# Patient Record
Sex: Female | Born: 1965
Health system: Southern US, Community
[De-identification: ages and names within clinical notes are randomized; demographics above are authoritative.]

## PROBLEM LIST (undated history)

## (undated) DIAGNOSIS — M1990 Unspecified osteoarthritis, unspecified site: Secondary | ICD-10-CM

## (undated) DIAGNOSIS — K219 Gastro-esophageal reflux disease without esophagitis: Secondary | ICD-10-CM

## (undated) DIAGNOSIS — I10 Essential (primary) hypertension: Secondary | ICD-10-CM

## (undated) HISTORY — DX: Gastro-esophageal reflux disease without esophagitis: K21.9

## (undated) HISTORY — PX: ABDOMINAL HYSTERECTOMY: SHX81

## (undated) HISTORY — DX: Unspecified osteoarthritis, unspecified site: M19.90

---

## 2000-08-10 ENCOUNTER — Other Ambulatory Visit: Admission: RE | Admit: 2000-08-10 | Discharge: 2000-08-10 | Payer: Self-pay | Admitting: Obstetrics and Gynecology

## 2002-02-01 ENCOUNTER — Other Ambulatory Visit: Admission: RE | Admit: 2002-02-01 | Discharge: 2002-02-01 | Payer: Self-pay | Admitting: Obstetrics and Gynecology

## 2003-05-02 ENCOUNTER — Other Ambulatory Visit: Admission: RE | Admit: 2003-05-02 | Discharge: 2003-05-02 | Payer: Self-pay | Admitting: Obstetrics and Gynecology

## 2004-07-07 ENCOUNTER — Other Ambulatory Visit: Admission: RE | Admit: 2004-07-07 | Discharge: 2004-07-07 | Payer: Self-pay | Admitting: Obstetrics and Gynecology

## 2006-07-12 ENCOUNTER — Ambulatory Visit (HOSPITAL_COMMUNITY): Admission: RE | Admit: 2006-07-12 | Discharge: 2006-07-12 | Payer: Self-pay | Admitting: Family Medicine

## 2006-11-06 ENCOUNTER — Ambulatory Visit (HOSPITAL_COMMUNITY): Admission: RE | Admit: 2006-11-06 | Discharge: 2006-11-06 | Payer: Self-pay | Admitting: Family Medicine

## 2006-11-17 ENCOUNTER — Ambulatory Visit (HOSPITAL_COMMUNITY): Admission: RE | Admit: 2006-11-17 | Discharge: 2006-11-17 | Payer: Self-pay | Admitting: Family Medicine

## 2007-08-08 ENCOUNTER — Ambulatory Visit (HOSPITAL_COMMUNITY): Admission: RE | Admit: 2007-08-08 | Discharge: 2007-08-08 | Payer: Self-pay | Admitting: Obstetrics and Gynecology

## 2007-09-05 ENCOUNTER — Other Ambulatory Visit: Admission: RE | Admit: 2007-09-05 | Discharge: 2007-09-05 | Payer: Self-pay | Admitting: Obstetrics and Gynecology

## 2009-08-31 ENCOUNTER — Emergency Department (HOSPITAL_COMMUNITY): Admission: EM | Admit: 2009-08-31 | Discharge: 2009-08-31 | Payer: Self-pay | Admitting: Emergency Medicine

## 2010-01-31 ENCOUNTER — Encounter: Payer: Self-pay | Admitting: Family Medicine

## 2010-03-26 LAB — URINALYSIS, ROUTINE W REFLEX MICROSCOPIC
Bilirubin Urine: NEGATIVE
Glucose, UA: NEGATIVE mg/dL
Hgb urine dipstick: NEGATIVE
Nitrite: NEGATIVE
Protein, ur: NEGATIVE mg/dL

## 2010-09-27 ENCOUNTER — Ambulatory Visit (HOSPITAL_COMMUNITY)
Admission: RE | Admit: 2010-09-27 | Discharge: 2010-09-27 | Disposition: A | Discharge status: Home / Self Care | Payer: PRIVATE HEALTH INSURANCE | Source: Ambulatory Visit | Attending: Family Medicine | Admitting: Family Medicine

## 2010-09-27 ENCOUNTER — Other Ambulatory Visit (HOSPITAL_COMMUNITY): Payer: Self-pay | Admitting: Family Medicine

## 2010-09-27 DIAGNOSIS — K219 Gastro-esophageal reflux disease without esophagitis: Secondary | ICD-10-CM

## 2010-09-27 DIAGNOSIS — R079 Chest pain, unspecified: Secondary | ICD-10-CM | POA: Insufficient documentation

## 2010-09-27 DIAGNOSIS — R7611 Nonspecific reaction to tuberculin skin test without active tuberculosis: Secondary | ICD-10-CM | POA: Insufficient documentation

## 2010-09-27 DIAGNOSIS — R0602 Shortness of breath: Secondary | ICD-10-CM | POA: Insufficient documentation

## 2010-10-08 ENCOUNTER — Ambulatory Visit (HOSPITAL_COMMUNITY)
Admission: RE | Admit: 2010-10-08 | Discharge: 2010-10-08 | Disposition: A | Discharge status: Home / Self Care | Payer: PRIVATE HEALTH INSURANCE | Source: Ambulatory Visit | Attending: Cardiovascular Disease | Admitting: Cardiovascular Disease

## 2010-10-08 DIAGNOSIS — R0602 Shortness of breath: Secondary | ICD-10-CM | POA: Insufficient documentation

## 2010-10-15 NOTE — Procedures (Signed)
NAME:  Wendy Rodriguez, Wendy Rodriguez                       ACCOUNT NO.:  MEDICAL RECORD NO.:  [id number]  LOCATION:                                 FACILITY:  PHYSICIAN:  Amelia Macken L. Juanetta Gosling, M.D.DATE OF BIRTH:  08/03/1965  DATE OF PROCEDURE: DATE OF DISCHARGE:                           PULMONARY FUNCTION TEST   REASON FOR PULMONARY FUNCTION TESTING:  Shortness of breath. 1. Spirometry is normal. 2. Lung volumes are normal. 3. DLCO is normal. 4. There is no cause of shortness of breath demonstrated on this PFT.     Aki Abalos L. Juanetta Gosling, M.D.     ELH/MEDQ  D:  10/14/2010  T:  10/14/2010  Job:  [id number]  cc:   Thurmon Fair, MD Fax: [phone]

## 2011-09-09 ENCOUNTER — Ambulatory Visit (HOSPITAL_COMMUNITY)
Admission: RE | Admit: 2011-09-09 | Discharge: 2011-09-09 | Disposition: A | Discharge status: Home / Self Care | Payer: Commercial Managed Care - PPO | Source: Ambulatory Visit | Attending: Family Medicine | Admitting: Family Medicine

## 2011-09-09 ENCOUNTER — Other Ambulatory Visit (HOSPITAL_COMMUNITY): Payer: Self-pay | Admitting: Family Medicine

## 2011-09-09 DIAGNOSIS — R079 Chest pain, unspecified: Secondary | ICD-10-CM

## 2011-09-09 DIAGNOSIS — R059 Cough, unspecified: Secondary | ICD-10-CM | POA: Insufficient documentation

## 2011-09-09 DIAGNOSIS — R7611 Nonspecific reaction to tuberculin skin test without active tuberculosis: Secondary | ICD-10-CM | POA: Insufficient documentation

## 2011-09-09 DIAGNOSIS — K219 Gastro-esophageal reflux disease without esophagitis: Secondary | ICD-10-CM

## 2011-09-09 DIAGNOSIS — R05 Cough: Secondary | ICD-10-CM | POA: Insufficient documentation

## 2011-09-09 DIAGNOSIS — R0602 Shortness of breath: Secondary | ICD-10-CM | POA: Insufficient documentation

## 2014-10-09 ENCOUNTER — Encounter (HOSPITAL_COMMUNITY): Admission: RE | Payer: Self-pay | Source: Ambulatory Visit

## 2014-10-09 ENCOUNTER — Ambulatory Visit (HOSPITAL_COMMUNITY)
Admission: RE | Admit: 2014-10-09 | Payer: Commercial Managed Care - PPO | Source: Ambulatory Visit | Admitting: Obstetrics and Gynecology

## 2014-10-09 SURGERY — HYSTERECTOMY, VAGINAL, LAPAROSCOPY-ASSISTED
Anesthesia: General

## 2015-11-12 ENCOUNTER — Other Ambulatory Visit: Payer: Self-pay | Admitting: Orthopedic Surgery

## 2015-11-12 DIAGNOSIS — M4316 Spondylolisthesis, lumbar region: Secondary | ICD-10-CM

## 2015-11-18 ENCOUNTER — Inpatient Hospital Stay: Admission: RE | Admit: 2015-11-18 | Payer: Commercial Managed Care - PPO | Source: Ambulatory Visit

## 2016-01-29 DIAGNOSIS — N950 Postmenopausal bleeding: Secondary | ICD-10-CM | POA: Diagnosis not present

## 2016-01-29 DIAGNOSIS — R799 Abnormal finding of blood chemistry, unspecified: Secondary | ICD-10-CM | POA: Diagnosis not present

## 2016-02-03 DIAGNOSIS — R945 Abnormal results of liver function studies: Secondary | ICD-10-CM | POA: Diagnosis not present

## 2016-02-23 DIAGNOSIS — N950 Postmenopausal bleeding: Secondary | ICD-10-CM | POA: Diagnosis not present

## 2016-02-23 DIAGNOSIS — R102 Pelvic and perineal pain: Secondary | ICD-10-CM | POA: Diagnosis not present

## 2016-04-08 DIAGNOSIS — R7989 Other specified abnormal findings of blood chemistry: Secondary | ICD-10-CM | POA: Diagnosis not present

## 2016-04-11 DIAGNOSIS — Z23 Encounter for immunization: Secondary | ICD-10-CM | POA: Diagnosis not present

## 2016-04-14 ENCOUNTER — Ambulatory Visit
Admission: RE | Admit: 2016-04-14 | Discharge: 2016-04-14 | Disposition: A | Discharge status: Home / Self Care | Payer: 59 | Source: Ambulatory Visit | Attending: Orthopedic Surgery | Admitting: Orthopedic Surgery

## 2016-04-14 DIAGNOSIS — M48061 Spinal stenosis, lumbar region without neurogenic claudication: Secondary | ICD-10-CM | POA: Diagnosis not present

## 2016-04-14 DIAGNOSIS — M4316 Spondylolisthesis, lumbar region: Secondary | ICD-10-CM

## 2016-04-14 DIAGNOSIS — R945 Abnormal results of liver function studies: Secondary | ICD-10-CM | POA: Diagnosis not present

## 2016-04-22 DIAGNOSIS — M47816 Spondylosis without myelopathy or radiculopathy, lumbar region: Secondary | ICD-10-CM | POA: Diagnosis not present

## 2016-04-22 DIAGNOSIS — M4316 Spondylolisthesis, lumbar region: Secondary | ICD-10-CM | POA: Diagnosis not present

## 2016-05-04 DIAGNOSIS — M47812 Spondylosis without myelopathy or radiculopathy, cervical region: Secondary | ICD-10-CM | POA: Diagnosis not present

## 2016-05-04 DIAGNOSIS — M4126 Other idiopathic scoliosis, lumbar region: Secondary | ICD-10-CM | POA: Diagnosis not present

## 2016-05-04 DIAGNOSIS — M47816 Spondylosis without myelopathy or radiculopathy, lumbar region: Secondary | ICD-10-CM | POA: Diagnosis not present

## 2016-05-11 DIAGNOSIS — Z23 Encounter for immunization: Secondary | ICD-10-CM | POA: Diagnosis not present

## 2016-05-18 DIAGNOSIS — M47816 Spondylosis without myelopathy or radiculopathy, lumbar region: Secondary | ICD-10-CM | POA: Diagnosis not present

## 2016-05-20 DIAGNOSIS — S9031XA Contusion of right foot, initial encounter: Secondary | ICD-10-CM | POA: Diagnosis not present

## 2016-06-14 DIAGNOSIS — M47816 Spondylosis without myelopathy or radiculopathy, lumbar region: Secondary | ICD-10-CM | POA: Diagnosis not present

## 2016-08-18 DIAGNOSIS — S92515A Nondisplaced fracture of proximal phalanx of left lesser toe(s), initial encounter for closed fracture: Secondary | ICD-10-CM | POA: Diagnosis not present

## 2016-08-18 DIAGNOSIS — M79672 Pain in left foot: Secondary | ICD-10-CM | POA: Diagnosis not present

## 2016-10-06 DIAGNOSIS — M722 Plantar fascial fibromatosis: Secondary | ICD-10-CM | POA: Diagnosis not present

## 2016-10-06 DIAGNOSIS — M79672 Pain in left foot: Secondary | ICD-10-CM | POA: Diagnosis not present

## 2016-10-12 DIAGNOSIS — M47817 Spondylosis without myelopathy or radiculopathy, lumbosacral region: Secondary | ICD-10-CM | POA: Diagnosis not present

## 2016-10-12 DIAGNOSIS — M47812 Spondylosis without myelopathy or radiculopathy, cervical region: Secondary | ICD-10-CM | POA: Diagnosis not present

## 2016-10-12 DIAGNOSIS — M4126 Other idiopathic scoliosis, lumbar region: Secondary | ICD-10-CM | POA: Diagnosis not present

## 2016-10-12 DIAGNOSIS — M47816 Spondylosis without myelopathy or radiculopathy, lumbar region: Secondary | ICD-10-CM | POA: Diagnosis not present

## 2016-10-18 DIAGNOSIS — Z23 Encounter for immunization: Secondary | ICD-10-CM | POA: Diagnosis not present

## 2016-12-16 DIAGNOSIS — J069 Acute upper respiratory infection, unspecified: Secondary | ICD-10-CM | POA: Diagnosis not present

## 2016-12-28 DIAGNOSIS — Z01419 Encounter for gynecological examination (general) (routine) without abnormal findings: Secondary | ICD-10-CM | POA: Diagnosis not present

## 2017-01-04 DIAGNOSIS — K219 Gastro-esophageal reflux disease without esophagitis: Secondary | ICD-10-CM | POA: Diagnosis not present

## 2017-01-04 DIAGNOSIS — H6991 Unspecified Eustachian tube disorder, right ear: Secondary | ICD-10-CM | POA: Diagnosis not present

## 2017-01-04 DIAGNOSIS — H6591 Unspecified nonsuppurative otitis media, right ear: Secondary | ICD-10-CM | POA: Diagnosis not present

## 2017-03-24 DIAGNOSIS — H60502 Unspecified acute noninfective otitis externa, left ear: Secondary | ICD-10-CM | POA: Diagnosis not present

## 2017-03-24 DIAGNOSIS — H6692 Otitis media, unspecified, left ear: Secondary | ICD-10-CM | POA: Diagnosis not present

## 2017-03-24 DIAGNOSIS — J302 Other seasonal allergic rhinitis: Secondary | ICD-10-CM | POA: Diagnosis not present

## 2017-03-24 DIAGNOSIS — E663 Overweight: Secondary | ICD-10-CM | POA: Diagnosis not present

## 2017-10-31 DIAGNOSIS — Z23 Encounter for immunization: Secondary | ICD-10-CM | POA: Diagnosis not present

## 2017-11-17 DIAGNOSIS — Z13220 Encounter for screening for lipoid disorders: Secondary | ICD-10-CM | POA: Diagnosis not present

## 2017-11-17 DIAGNOSIS — Z131 Encounter for screening for diabetes mellitus: Secondary | ICD-10-CM | POA: Diagnosis not present

## 2017-11-17 DIAGNOSIS — Z0000 Encounter for general adult medical examination without abnormal findings: Secondary | ICD-10-CM | POA: Diagnosis not present

## 2017-11-17 DIAGNOSIS — M4316 Spondylolisthesis, lumbar region: Secondary | ICD-10-CM | POA: Diagnosis not present

## 2017-11-17 DIAGNOSIS — M545 Low back pain: Secondary | ICD-10-CM | POA: Diagnosis not present

## 2017-11-17 DIAGNOSIS — Z13228 Encounter for screening for other metabolic disorders: Secondary | ICD-10-CM | POA: Diagnosis not present

## 2017-11-17 DIAGNOSIS — M542 Cervicalgia: Secondary | ICD-10-CM | POA: Diagnosis not present

## 2017-11-17 DIAGNOSIS — M4126 Other idiopathic scoliosis, lumbar region: Secondary | ICD-10-CM | POA: Diagnosis not present

## 2017-11-17 DIAGNOSIS — M47816 Spondylosis without myelopathy or radiculopathy, lumbar region: Secondary | ICD-10-CM | POA: Diagnosis not present

## 2017-11-21 DIAGNOSIS — E663 Overweight: Secondary | ICD-10-CM | POA: Diagnosis not present

## 2017-11-21 DIAGNOSIS — I10 Essential (primary) hypertension: Secondary | ICD-10-CM | POA: Diagnosis not present

## 2017-11-21 DIAGNOSIS — Z6828 Body mass index (BMI) 28.0-28.9, adult: Secondary | ICD-10-CM | POA: Diagnosis not present

## 2017-11-29 DIAGNOSIS — Z6828 Body mass index (BMI) 28.0-28.9, adult: Secondary | ICD-10-CM | POA: Diagnosis not present

## 2017-11-29 DIAGNOSIS — E663 Overweight: Secondary | ICD-10-CM | POA: Diagnosis not present

## 2017-11-29 DIAGNOSIS — J22 Unspecified acute lower respiratory infection: Secondary | ICD-10-CM | POA: Diagnosis not present

## 2017-12-15 DIAGNOSIS — Z6829 Body mass index (BMI) 29.0-29.9, adult: Secondary | ICD-10-CM | POA: Diagnosis not present

## 2017-12-15 DIAGNOSIS — Z01419 Encounter for gynecological examination (general) (routine) without abnormal findings: Secondary | ICD-10-CM | POA: Diagnosis not present

## 2018-04-05 DIAGNOSIS — E663 Overweight: Secondary | ICD-10-CM | POA: Diagnosis not present

## 2018-04-05 DIAGNOSIS — Z6828 Body mass index (BMI) 28.0-28.9, adult: Secondary | ICD-10-CM | POA: Diagnosis not present

## 2018-04-05 DIAGNOSIS — L209 Atopic dermatitis, unspecified: Secondary | ICD-10-CM | POA: Diagnosis not present

## 2018-04-12 DIAGNOSIS — J329 Chronic sinusitis, unspecified: Secondary | ICD-10-CM | POA: Diagnosis not present

## 2018-04-12 DIAGNOSIS — K219 Gastro-esophageal reflux disease without esophagitis: Secondary | ICD-10-CM | POA: Diagnosis not present

## 2018-04-12 DIAGNOSIS — J309 Allergic rhinitis, unspecified: Secondary | ICD-10-CM | POA: Diagnosis not present

## 2019-01-13 ENCOUNTER — Ambulatory Visit
Admission: EM | Admit: 2019-01-13 | Discharge: 2019-01-13 | Disposition: A | Discharge status: Home / Self Care | Payer: BC Managed Care – PPO | Attending: Emergency Medicine | Admitting: Emergency Medicine

## 2019-01-13 ENCOUNTER — Other Ambulatory Visit: Payer: Self-pay

## 2019-01-13 DIAGNOSIS — Z20822 Contact with and (suspected) exposure to covid-19: Secondary | ICD-10-CM | POA: Diagnosis not present

## 2019-01-13 HISTORY — DX: Essential (primary) hypertension: I10

## 2019-01-13 MED ORDER — FLUTICASONE PROPIONATE 50 MCG/ACT NA SUSP: 1.0000 | Freq: Every day | NASAL | 0 refills | Status: DC

## 2019-01-13 MED ORDER — BENZONATATE 100 MG PO CAPS: 100.0000 mg | ORAL_CAPSULE | Freq: Three times a day (TID) | ORAL | 0 refills | Status: DC

## 2019-01-13 NOTE — ED Provider Notes (Signed)
[facility]    CSN: [id number] Arrival date & time: 01/13/19  1315      History   Chief Complaint Chief Complaint  Patient presents with  . Cough  . Headache    HPI Wendy Rodriguez is a 54 y.o. female.   Wendy Rodriguez 54 years old female presented to the urgent care with a complaint of cough, and headaches for the past 3 days.  Denies sick exposure to COVID, flu or strep.  Denies recent travel.  Denies aggravating or alleviating symptoms.  Denies previous COVID infection.   Denies fever, chills, fatigue, nasal congestion, rhinorrhea, sore throat,  SOB, wheezing, chest pain, nausea, vomiting, changes in bowel or bladder habits.    The history is provided by the patient. No language interpreter was used.  Cough Associated symptoms: headaches   Headache Associated symptoms: cough     Past Medical History:  Diagnosis Date  . Hypertension     There are no problems to display for this patient.   Past Surgical History:  Procedure Laterality Date  . ABDOMINAL HYSTERECTOMY      OB History   No obstetric history on file.      Home Medications    Prior to Admission medications   Medication Sig Start Date End Date Taking? Authorizing Provider  benzonatate (TESSALON) 100 MG capsule Take 1 capsule (100 mg total) by mouth every 8 (eight) hours. 01/13/19   Jamion Carter, Darrelyn Hillock, FNP  estradiol (ESTRACE) 2 MG tablet  12/31/18   [provider]  fluticasone (FLONASE) 50 MCG/ACT nasal spray Place 1 spray into both nostrils daily for 14 days. 01/13/19 01/27/19  Emerson Monte, FNP  losartan (COZAAR) 100 MG tablet  12/31/18   [provider]    Family History Family History  Problem Relation Age of Onset  . Hypertension Mother   . Hypertension Father     Social History Social History   Tobacco Use  . Smoking status: Never Smoker  . Smokeless tobacco: Never Used  Substance Use Topics  . Alcohol use: Not Currently  . Drug use: Never      Allergies   Sulfa antibiotics   Review of Systems Review of Systems  Constitutional: Negative.   HENT: Negative.   Respiratory: Positive for cough.   Cardiovascular: Negative.   Gastrointestinal: Negative.   Neurological: Positive for headaches.     Physical Exam Triage Vital Signs ED Triage Vitals [01/13/19 1331]  Enc Vitals Group     BP      Pulse      Resp      Temp      Temp src      SpO2      Weight      Height      Head Circumference      Peak Flow      Pain Score 8     Pain Loc      Pain Edu?      Excl. in [place]?    No data found.  Updated Vital Signs BP (!) 149/83   Pulse 65   Temp 98.3 F (36.8 C)   Resp 18   LMP 09/02/2011   SpO2 97%   Visual Acuity Right Eye Distance:   Left Eye Distance:   Bilateral Distance:    Right Eye Near:   Left Eye Near:    Bilateral Near:     Physical Exam Vitals and nursing note reviewed.  Constitutional:  General: She is not in acute distress.    Appearance: Normal appearance. She is normal weight. She is not ill-appearing or toxic-appearing.  HENT:     Head: Normocephalic.     Right Ear: Tympanic membrane, ear canal and external ear normal. There is no impacted cerumen.     Left Ear: Tympanic membrane, ear canal and external ear normal. There is no impacted cerumen.     Nose: Nose normal. No congestion.     Mouth/Throat:     Mouth: Mucous membranes are moist.     Pharynx: No oropharyngeal exudate or posterior oropharyngeal erythema.  Cardiovascular:     Rate and Rhythm: Normal rate and regular rhythm.     Pulses: Normal pulses.     Heart sounds: Normal heart sounds. No murmur.  Pulmonary:     Effort: Pulmonary effort is normal. No respiratory distress.     Breath sounds: No wheezing or rhonchi.  Chest:     Chest wall: No tenderness.  Abdominal:     General: Abdomen is flat. Bowel sounds are normal. There is no distension.     Palpations: There is no mass.  Skin:    Capillary Refill:  Capillary refill takes less than 2 seconds.  Neurological:     Mental Status: She is alert and oriented to person, place, and time.      UC Treatments / Results  Labs (all labs ordered are listed, but only abnormal results are displayed) Labs Reviewed  NOVEL CORONAVIRUS, NAA    EKG   Radiology No results found.  Procedures Procedures (including critical care time)  Medications Ordered in UC Medications - No data to display  Initial Impression / Assessment and Plan / UC Course  I have reviewed the triage vital signs and the nursing notes.  Pertinent labs & imaging results that were available during my care of the patient were reviewed by me and considered in my medical decision making (see chart for details).   COVID-19 test was ordered.  Patient was advised to quarantine until COVID-19 test result become available.  Work note was given.  To go to ED for worsening of symptoms.  Patient verbalized understanding of the plan of care.  Final Clinical Impressions(s) / UC Diagnoses   Final diagnoses:  Suspected COVID-19 virus infection     Discharge Instructions     COVID testing ordered.  It will take between 2-7 days for test results.  Someone will contact you regarding abnormal results.    In the meantime: You should remain isolated in your home for 10 days from symptom onset  Get plenty of rest and push fluids Tessalon Perles prescribed for cough Flonase prescribed for nasal congestion and runny nose Use medications daily for symptom relief Use OTC medications like ibuprofen or tylenol as needed fever or pain Call or go to the ED if you have any new or worsening symptoms such as fever, worsening cough, shortness of breath, chest tightness, chest pain, turning blue, changes in mental status, etc...     ED Prescriptions    Medication Sig Dispense Auth. Provider   benzonatate (TESSALON) 100 MG capsule Take 1 capsule (100 mg total) by mouth every 8 (eight) hours. 21  capsule Burnell Hurta S, FNP   fluticasone (FLONASE) 50 MCG/ACT nasal spray Place 1 spray into both nostrils daily for 14 days. 16 g Durward Parcel, FNP     PDMP not reviewed this encounter.   Durward Parcel, FNP 01/13/19 1351

## 2019-01-13 NOTE — Discharge Instructions (Addendum)
COVID testing ordered.  It will take between 2-7 days for test results.  Someone will contact you regarding abnormal results.    In the meantime: You should remain isolated in your home for 10 days from symptom onset  Get plenty of rest and push fluids Tessalon Perles prescribed for cough Flonase prescribed for nasal congestion and runny nose Use medications daily for symptom relief Use OTC medications like ibuprofen or tylenol as needed fever or pain Call or go to the ED if you have any new or worsening symptoms such as fever, worsening cough, shortness of breath, chest tightness, chest pain, turning blue, changes in mental status, etc...  

## 2019-01-13 NOTE — ED Triage Notes (Signed)
Pt presents with c/o nasal congestion , cough and headache that began on Friday

## 2019-01-15 LAB — NOVEL CORONAVIRUS, NAA: SARS-CoV-2, NAA: DETECTED — AB

## 2019-01-16 ENCOUNTER — Ambulatory Visit: Payer: BLUE CROSS/BLUE SHIELD | Attending: Internal Medicine

## 2019-01-16 ENCOUNTER — Telehealth (HOSPITAL_COMMUNITY): Payer: Self-pay | Admitting: Emergency Medicine

## 2019-01-16 NOTE — Telephone Encounter (Signed)
Your test for COVID-19 was positive, meaning that you were infected with the novel coronavirus and could give the germ to others.  Please continue isolation at home for at least 10 days since the start of your symptoms. If you do not have symptoms, please isolate at home for 10 days from the day you were tested. Once you complete your 10 day quarantine, you may return to normal activities as long as you've not had a fever for over 24 hours(without taking fever reducing medicine) and your symptoms are improving. Please continue good preventive care measures, including:  frequent hand-washing, avoid touching your face, cover coughs/sneezes, stay out of crowds and keep a 6 foot distance from others.  Go to the nearest hospital emergency room if fever/cough/breathlessness are severe or illness seems like a threat to life.  Patient contacted by phone and made aware of    results. Pt verbalized understanding and had all questions answered.  Quarantine ends Jan 13th

## 2019-01-31 ENCOUNTER — Ambulatory Visit (HOSPITAL_COMMUNITY)
Admission: RE | Admit: 2019-01-31 | Discharge: 2019-01-31 | Disposition: A | Discharge status: Home / Self Care | Payer: BC Managed Care – PPO | Source: Ambulatory Visit | Attending: Internal Medicine | Admitting: Internal Medicine

## 2019-01-31 ENCOUNTER — Other Ambulatory Visit (HOSPITAL_COMMUNITY): Payer: Self-pay | Admitting: Internal Medicine

## 2019-01-31 ENCOUNTER — Other Ambulatory Visit: Payer: Self-pay

## 2019-01-31 DIAGNOSIS — R05 Cough: Secondary | ICD-10-CM

## 2019-01-31 DIAGNOSIS — R918 Other nonspecific abnormal finding of lung field: Secondary | ICD-10-CM | POA: Diagnosis not present

## 2019-01-31 DIAGNOSIS — U071 COVID-19: Secondary | ICD-10-CM | POA: Diagnosis not present

## 2019-01-31 DIAGNOSIS — Z8616 Personal history of COVID-19: Secondary | ICD-10-CM | POA: Insufficient documentation

## 2019-01-31 DIAGNOSIS — Z6828 Body mass index (BMI) 28.0-28.9, adult: Secondary | ICD-10-CM | POA: Diagnosis not present

## 2019-01-31 DIAGNOSIS — R059 Cough, unspecified: Secondary | ICD-10-CM

## 2019-01-31 DIAGNOSIS — R0602 Shortness of breath: Secondary | ICD-10-CM | POA: Diagnosis not present

## 2019-02-04 ENCOUNTER — Other Ambulatory Visit: Payer: Self-pay | Admitting: Internal Medicine

## 2019-02-04 DIAGNOSIS — R928 Other abnormal and inconclusive findings on diagnostic imaging of breast: Secondary | ICD-10-CM

## 2019-02-08 DIAGNOSIS — J1282 Pneumonia due to coronavirus disease 2019: Secondary | ICD-10-CM | POA: Diagnosis not present

## 2019-02-08 DIAGNOSIS — Z6829 Body mass index (BMI) 29.0-29.9, adult: Secondary | ICD-10-CM | POA: Diagnosis not present

## 2019-02-08 DIAGNOSIS — K219 Gastro-esophageal reflux disease without esophagitis: Secondary | ICD-10-CM | POA: Diagnosis not present

## 2019-02-08 DIAGNOSIS — M1991 Primary osteoarthritis, unspecified site: Secondary | ICD-10-CM | POA: Diagnosis not present

## 2019-02-08 DIAGNOSIS — I10 Essential (primary) hypertension: Secondary | ICD-10-CM | POA: Diagnosis not present

## 2019-03-15 DIAGNOSIS — M25562 Pain in left knee: Secondary | ICD-10-CM | POA: Diagnosis not present

## 2019-04-11 DIAGNOSIS — M25562 Pain in left knee: Secondary | ICD-10-CM | POA: Diagnosis not present

## 2019-04-15 DIAGNOSIS — S83242A Other tear of medial meniscus, current injury, left knee, initial encounter: Secondary | ICD-10-CM | POA: Diagnosis not present

## 2019-05-01 DIAGNOSIS — J3089 Other allergic rhinitis: Secondary | ICD-10-CM | POA: Diagnosis not present

## 2019-05-01 DIAGNOSIS — T781XXD Other adverse food reactions, not elsewhere classified, subsequent encounter: Secondary | ICD-10-CM | POA: Diagnosis not present

## 2019-05-01 DIAGNOSIS — K219 Gastro-esophageal reflux disease without esophagitis: Secondary | ICD-10-CM | POA: Diagnosis not present

## 2019-05-24 DIAGNOSIS — R1310 Dysphagia, unspecified: Secondary | ICD-10-CM | POA: Diagnosis not present

## 2019-05-24 DIAGNOSIS — K219 Gastro-esophageal reflux disease without esophagitis: Secondary | ICD-10-CM | POA: Diagnosis not present

## 2019-06-06 DIAGNOSIS — M4316 Spondylolisthesis, lumbar region: Secondary | ICD-10-CM | POA: Diagnosis not present

## 2019-06-06 DIAGNOSIS — M4126 Other idiopathic scoliosis, lumbar region: Secondary | ICD-10-CM | POA: Diagnosis not present

## 2019-06-06 DIAGNOSIS — M47812 Spondylosis without myelopathy or radiculopathy, cervical region: Secondary | ICD-10-CM | POA: Diagnosis not present

## 2019-06-06 DIAGNOSIS — M7918 Myalgia, other site: Secondary | ICD-10-CM | POA: Diagnosis not present

## 2019-06-06 DIAGNOSIS — M47816 Spondylosis without myelopathy or radiculopathy, lumbar region: Secondary | ICD-10-CM | POA: Diagnosis not present

## 2019-06-14 DIAGNOSIS — Z1152 Encounter for screening for COVID-19: Secondary | ICD-10-CM | POA: Diagnosis not present

## 2019-06-20 DIAGNOSIS — K228 Other specified diseases of esophagus: Secondary | ICD-10-CM | POA: Diagnosis not present

## 2019-06-20 DIAGNOSIS — K449 Diaphragmatic hernia without obstruction or gangrene: Secondary | ICD-10-CM | POA: Diagnosis not present

## 2019-06-20 DIAGNOSIS — K222 Esophageal obstruction: Secondary | ICD-10-CM | POA: Diagnosis not present

## 2019-06-20 DIAGNOSIS — R1310 Dysphagia, unspecified: Secondary | ICD-10-CM | POA: Diagnosis not present

## 2019-07-05 DIAGNOSIS — M25561 Pain in right knee: Secondary | ICD-10-CM | POA: Diagnosis not present

## 2019-07-31 DIAGNOSIS — K219 Gastro-esophageal reflux disease without esophagitis: Secondary | ICD-10-CM | POA: Diagnosis not present

## 2019-07-31 DIAGNOSIS — K222 Esophageal obstruction: Secondary | ICD-10-CM | POA: Diagnosis not present

## 2019-08-06 DIAGNOSIS — M5136 Other intervertebral disc degeneration, lumbar region: Secondary | ICD-10-CM | POA: Diagnosis not present

## 2019-08-06 DIAGNOSIS — G8929 Other chronic pain: Secondary | ICD-10-CM | POA: Diagnosis not present

## 2019-08-06 DIAGNOSIS — M5442 Lumbago with sciatica, left side: Secondary | ICD-10-CM | POA: Diagnosis not present

## 2019-08-06 DIAGNOSIS — I10 Essential (primary) hypertension: Secondary | ICD-10-CM | POA: Diagnosis not present

## 2019-08-07 ENCOUNTER — Other Ambulatory Visit: Payer: Self-pay | Admitting: Neurosurgery

## 2019-08-07 DIAGNOSIS — G8929 Other chronic pain: Secondary | ICD-10-CM

## 2019-09-01 ENCOUNTER — Other Ambulatory Visit: Payer: Self-pay

## 2019-09-01 ENCOUNTER — Inpatient Hospital Stay: Admission: RE | Admit: 2019-09-01 | Payer: BC Managed Care – PPO | Source: Ambulatory Visit

## 2019-09-01 ENCOUNTER — Ambulatory Visit
Admission: RE | Admit: 2019-09-01 | Discharge: 2019-09-01 | Disposition: A | Discharge status: Home / Self Care | Payer: BC Managed Care – PPO | Source: Ambulatory Visit | Attending: Neurosurgery | Admitting: Neurosurgery

## 2019-09-01 DIAGNOSIS — M5442 Lumbago with sciatica, left side: Secondary | ICD-10-CM

## 2019-09-01 DIAGNOSIS — M48061 Spinal stenosis, lumbar region without neurogenic claudication: Secondary | ICD-10-CM | POA: Diagnosis not present

## 2019-09-03 ENCOUNTER — Other Ambulatory Visit: Payer: BC Managed Care – PPO

## 2019-09-06 DIAGNOSIS — M4316 Spondylolisthesis, lumbar region: Secondary | ICD-10-CM | POA: Diagnosis not present

## 2019-09-06 DIAGNOSIS — Z6829 Body mass index (BMI) 29.0-29.9, adult: Secondary | ICD-10-CM | POA: Diagnosis not present

## 2019-09-06 DIAGNOSIS — I10 Essential (primary) hypertension: Secondary | ICD-10-CM | POA: Diagnosis not present

## 2019-10-31 DIAGNOSIS — M47816 Spondylosis without myelopathy or radiculopathy, lumbar region: Secondary | ICD-10-CM | POA: Diagnosis not present

## 2019-10-31 DIAGNOSIS — M4316 Spondylolisthesis, lumbar region: Secondary | ICD-10-CM | POA: Diagnosis not present

## 2019-10-31 DIAGNOSIS — M48061 Spinal stenosis, lumbar region without neurogenic claudication: Secondary | ICD-10-CM | POA: Diagnosis not present

## 2019-11-04 DIAGNOSIS — I10 Essential (primary) hypertension: Secondary | ICD-10-CM | POA: Diagnosis not present

## 2019-11-04 DIAGNOSIS — K219 Gastro-esophageal reflux disease without esophagitis: Secondary | ICD-10-CM | POA: Diagnosis not present

## 2019-11-04 DIAGNOSIS — Z79899 Other long term (current) drug therapy: Secondary | ICD-10-CM | POA: Diagnosis not present

## 2019-11-04 DIAGNOSIS — I341 Nonrheumatic mitral (valve) prolapse: Secondary | ICD-10-CM | POA: Diagnosis not present

## 2019-11-04 DIAGNOSIS — Z6828 Body mass index (BMI) 28.0-28.9, adult: Secondary | ICD-10-CM | POA: Diagnosis not present

## 2019-11-04 DIAGNOSIS — Z23 Encounter for immunization: Secondary | ICD-10-CM | POA: Diagnosis not present

## 2019-11-04 DIAGNOSIS — M1991 Primary osteoarthritis, unspecified site: Secondary | ICD-10-CM | POA: Diagnosis not present

## 2019-11-06 DIAGNOSIS — I10 Essential (primary) hypertension: Secondary | ICD-10-CM | POA: Diagnosis not present

## 2019-11-06 DIAGNOSIS — Z23 Encounter for immunization: Secondary | ICD-10-CM | POA: Diagnosis not present

## 2019-11-06 DIAGNOSIS — Z79899 Other long term (current) drug therapy: Secondary | ICD-10-CM | POA: Diagnosis not present

## 2019-12-03 DIAGNOSIS — I10 Essential (primary) hypertension: Secondary | ICD-10-CM | POA: Diagnosis not present

## 2019-12-03 DIAGNOSIS — Z6829 Body mass index (BMI) 29.0-29.9, adult: Secondary | ICD-10-CM | POA: Diagnosis not present

## 2019-12-03 DIAGNOSIS — M461 Sacroiliitis, not elsewhere classified: Secondary | ICD-10-CM | POA: Diagnosis not present

## 2019-12-12 DIAGNOSIS — Z01419 Encounter for gynecological examination (general) (routine) without abnormal findings: Secondary | ICD-10-CM | POA: Diagnosis not present

## 2019-12-12 DIAGNOSIS — Z13820 Encounter for screening for osteoporosis: Secondary | ICD-10-CM | POA: Diagnosis not present

## 2019-12-12 DIAGNOSIS — Z6829 Body mass index (BMI) 29.0-29.9, adult: Secondary | ICD-10-CM | POA: Diagnosis not present

## 2019-12-12 DIAGNOSIS — Z1231 Encounter for screening mammogram for malignant neoplasm of breast: Secondary | ICD-10-CM | POA: Diagnosis not present

## 2020-01-24 ENCOUNTER — Ambulatory Visit: Payer: BC Managed Care – PPO | Admitting: Cardiovascular Disease

## 2020-01-24 ENCOUNTER — Encounter: Payer: Self-pay | Admitting: Cardiovascular Disease

## 2020-01-24 ENCOUNTER — Other Ambulatory Visit: Payer: Self-pay

## 2020-01-24 VITALS — BP 124/74 | HR 61 | Ht 67.0 in | Wt 190.4 lb

## 2020-01-24 DIAGNOSIS — I10 Essential (primary) hypertension: Secondary | ICD-10-CM

## 2020-01-24 MED ORDER — LOSARTAN POTASSIUM 100 MG PO TABS: 100.0000 mg | ORAL_TABLET | Freq: Every day | ORAL | 3 refills | Status: DC

## 2020-01-24 MED ORDER — AMLODIPINE BESYLATE 5 MG PO TABS: 5.0000 mg | ORAL_TABLET | Freq: Every day | ORAL | 3 refills | Status: DC

## 2020-01-24 NOTE — Progress Notes (Signed)
Cardiology Office Note:    Date:  01/24/2020   ID:  Allena Napoleon, DOB 1965-07-26, MRN [id number]  PCP:  Elfredia Nevins, MD  [facility] Cardiologist:  Thurmon Fair, MD  [facility] Electrophysiologist:  None   Referring MD: Assunta Found, MD   Chief Complaint  Patient presents with  . Hypertension    History of Present Illness:    Wendy Rodriguez is a 55 y.o. female with a hx of systemic hypertension who has had some adjustments in her medications recently.  Her husband Tawanna Cooler is my patient as well.  Has been taking gradually increasing doses of losartan for hypertension for several years.  Recently this became insufficient to control her blood pressure and she was started on amlodipine.  Since then she has developed a little bit of ankle swelling that is sometimes prominent in the evening but always resolves by the next morning.  She has not had dizziness or syncope.  She does not have palpitations.  She denies shortness of breath at rest or with activity, orthopnea, PND.  She denies chest discomfort at rest or with activity.  She does not have a history of stroke/TIA or other neurological events.  She does not have intermittent claudication.  Before adding the amlodipine her blood pressure was in the 150s-160/80s-90s.  Only her blood pressure has been excellent in the 110-120s/70s.  She underwent total hysterectomy in 2018 and has been on estradiol supplements since.  She had a recent bone scan that showed normal bone density.  She has had problems with her lower back and knees for which she has received several steroid injections and has also received oral Medrol Dosepaks.  She has a history of esophageal reflux disease, symptoms well controlled with omeprazole.  She frequently feels cold, but she had a normal recent TSH.  She was prescribed escitalopram, presumably for perimenopausal symptoms, but did not tolerate this well since it makes her feel "foggy in the morning".  She has  generally favorable lipid profile.  Her HDL is excellent LDL is only borderline elevated at 107.  Other labs are all normal.  Past Medical History:  Diagnosis Date  . GERD (gastroesophageal reflux disease)   . Hypertension   . Osteoarthritis    Past Surgical History:  Procedure Laterality Date  . ABDOMINAL HYSTERECTOMY      Current Medications: Current Meds  Medication Sig  . Cholecalciferol (VITAMIN D3 PO) Vitamin D3  . estradiol (ESTRACE) 2 MG tablet   . famotidine (PEPCID) 40 MG tablet famotidine 40 mg tablet  . [DISCONTINUED] amLODipine (NORVASC) 5 MG tablet Take 5 mg by mouth daily. Take 1 tablet daily. Increase to 2 qd one week if bps over 139.  . [DISCONTINUED] losartan (COZAAR) 100 MG tablet      Allergies:   Sulfa antibiotics   Social History   Socioeconomic History  . Marital status: Married    Spouse name: Not on file  . Number of children: Not on file  . Years of education: Not on file  . Highest education level: Not on file  Occupational History  . Not on file  Tobacco Use  . Smoking status: Never Smoker  . Smokeless tobacco: Never Used  Substance and Sexual Activity  . Alcohol use: Not Currently  . Drug use: Never  . Sexual activity: Not on file  Other Topics Concern  . Not on file  Social History Narrative  . Not on file   Social Determinants of Health  Financial Resource Strain: Not on file  Food Insecurity: Not on file  Transportation Needs: Not on file  Physical Activity: Not on file  Stress: Not on file  Social Connections: Not on file     Family History: The patient's family history includes Cirrhosis in her maternal grandfather; Diabetes in her father and paternal grandmother; Heart disease in her paternal grandmother; Hyperlipidemia in her father; Hypertension in her father and mother; Throat cancer in her maternal grandmother; Thyroid disease in her mother.  ROS:   Please see the history of present illness.     All other systems  reviewed and are negative.  EKGs/Labs/Other Studies Reviewed:    The following studies were reviewed today: Labs from PCP  EKG:  EKG is ordered today.  The ekg ordered today demonstrates NSR, normal tracing  Recent Labs: No results found for requested labs within last 8760 hours.  11/04/2019 Creatinine 0.67, hemoglobin 12.6, TSH 2.82, hemoglobin A1c 5.3% Recent Lipid Panel No results found for: CHOL, TRIG, HDL, CHOLHDL, VLDL, LDLCALC, LDLDIRECT 11/04/2019 Total cholesterol 189, HDL 66, LDL 107, triglycerides 91  Risk Assessment/Calculations:       Physical Exam:    VS:  BP 124/74   Pulse 61   Ht 5\' 7"  (1.702 m)   Wt 190 lb 6.4 oz (86.4 kg)   LMP 09/02/2011   SpO2 97%   BMI 29.82 kg/m     Wt Readings from Last 3 Encounters:  01/24/20 190 lb 6.4 oz (86.4 kg)     GEN: Mildly overweight well nourished, well developed in no acute distress HEENT: Normal NECK: No JVD; No carotid bruits LYMPHATICS: No lymphadenopathy CARDIAC: RRR, no murmurs, rubs, gallops RESPIRATORY:  Clear to auscultation without rales, wheezing or rhonchi  ABDOMEN: Soft, non-tender, non-distended MUSCULOSKELETAL:  No edema; No deformity  SKIN: Warm and dry NEUROLOGIC:  Alert and oriented x 3 PSYCHIATRIC:  Normal affect   ASSESSMENT:    1. Essential hypertension    PLAN:    In order of problems listed above:  1. HTN: Excellent control on current calcium channel blocker/angiotensin receptor blocker combination.  Only has mild problems with ankle swelling that are tolerable.  Would continue the same medical regimen.  Pointed out that she may need to pay extra special attention to sodium restriction when she receives steroid joint injections or she receives a Medrol Dosepak.  Encouraged more regular physical activity and weight loss.  Discussed the fact that (barring other indications) the general paradigm for hormone replacement therapy for perimenopausal symptoms is to take it at the lowest  necessary dose for the shortest possible time.  She should try to gradually wean off of it.        Medication Adjustments/Labs and Tests Ordered: Current medicines are reviewed at length with the patient today.  Concerns regarding medicines are outlined above.  Orders Placed This Encounter  Procedures  . EKG 12-Lead   Meds ordered this encounter  Medications  . losartan (COZAAR) 100 MG tablet    Sig: Take 1 tablet (100 mg total) by mouth daily.    Dispense:  90 tablet    Refill:  3  . amLODipine (NORVASC) 5 MG tablet    Sig: Take 1 tablet (5 mg total) by mouth daily.    Dispense:  90 tablet    Refill:  3    Patient Instructions  Medication Instructions:  No changes *If you need a refill on your cardiac medications before your next appointment, please call your pharmacy*  Lab Work: None ordered If you have labs (blood work) drawn today and your tests are completely normal, you will receive your results only by: Marland Kitchen MyChart Message (if you have MyChart) OR . A paper copy in the mail If you have any lab test that is abnormal or we need to change your treatment, we will call you to review the results.   Testing/Procedures: None ordered   Follow-Up: At [facility], you and your health needs are our priority.  As part of our continuing mission to provide you with exceptional heart care, we have created designated Provider Care Teams.  These Care Teams include your primary Cardiologist (physician) and Advanced Practice Providers (APPs -  Physician Assistants and Nurse Practitioners) who all work together to provide you with the care you need, when you need it.  We recommend signing up for the patient portal called "MyChart".  Sign up information is provided on this After Visit Summary.  MyChart is used to connect with patients for Virtual Visits (Telemedicine).  Patients are able to view lab/test results, encounter notes, upcoming appointments, etc.  Non-urgent messages can be  sent to your provider as well.   To learn more about what you can do with MyChart, go to [email or web address].    Your next appointment:   12 month(s)  The format for your next appointment:   In Person  Provider:   You may see Thurmon Fair, MD or one of the following Advanced Practice Providers on your designated Care Team:    Azalee Course, PA-C  Micah Flesher, [place] or   Judy Pimple, PA-C       Signed, Thurmon Fair, MD  01/24/2020 10:19 AM    [facility]

## 2020-01-24 NOTE — Patient Instructions (Signed)

## 2020-03-06 DIAGNOSIS — N951 Menopausal and female climacteric states: Secondary | ICD-10-CM | POA: Diagnosis not present

## 2020-03-06 DIAGNOSIS — N8110 Cystocele, unspecified: Secondary | ICD-10-CM | POA: Diagnosis not present

## 2020-03-06 DIAGNOSIS — R3982 Chronic bladder pain: Secondary | ICD-10-CM | POA: Diagnosis not present

## 2020-04-16 DIAGNOSIS — I10 Essential (primary) hypertension: Secondary | ICD-10-CM | POA: Diagnosis not present

## 2020-04-16 DIAGNOSIS — R600 Localized edema: Secondary | ICD-10-CM | POA: Diagnosis not present

## 2020-04-16 DIAGNOSIS — L209 Atopic dermatitis, unspecified: Secondary | ICD-10-CM | POA: Diagnosis not present

## 2020-04-16 DIAGNOSIS — Z6829 Body mass index (BMI) 29.0-29.9, adult: Secondary | ICD-10-CM | POA: Diagnosis not present

## 2020-04-16 DIAGNOSIS — I872 Venous insufficiency (chronic) (peripheral): Secondary | ICD-10-CM | POA: Diagnosis not present

## 2020-09-07 DIAGNOSIS — I10 Essential (primary) hypertension: Secondary | ICD-10-CM | POA: Diagnosis not present

## 2020-09-07 DIAGNOSIS — E663 Overweight: Secondary | ICD-10-CM | POA: Diagnosis not present

## 2020-09-07 DIAGNOSIS — Z6829 Body mass index (BMI) 29.0-29.9, adult: Secondary | ICD-10-CM | POA: Diagnosis not present

## 2020-09-07 DIAGNOSIS — J329 Chronic sinusitis, unspecified: Secondary | ICD-10-CM | POA: Diagnosis not present

## 2020-09-07 DIAGNOSIS — M1991 Primary osteoarthritis, unspecified site: Secondary | ICD-10-CM | POA: Diagnosis not present

## 2020-11-06 DIAGNOSIS — Z23 Encounter for immunization: Secondary | ICD-10-CM | POA: Diagnosis not present

## 2020-11-10 DIAGNOSIS — Z1329 Encounter for screening for other suspected endocrine disorder: Secondary | ICD-10-CM | POA: Diagnosis not present

## 2020-11-10 DIAGNOSIS — Z131 Encounter for screening for diabetes mellitus: Secondary | ICD-10-CM | POA: Diagnosis not present

## 2020-11-10 DIAGNOSIS — Z130 Encounter for screening for diseases of the blood and blood-forming organs and certain disorders involving the immune mechanism: Secondary | ICD-10-CM | POA: Diagnosis not present

## 2020-11-10 DIAGNOSIS — Z1321 Encounter for screening for nutritional disorder: Secondary | ICD-10-CM | POA: Diagnosis not present

## 2020-11-19 DIAGNOSIS — Z01419 Encounter for gynecological examination (general) (routine) without abnormal findings: Secondary | ICD-10-CM | POA: Diagnosis not present

## 2020-11-19 DIAGNOSIS — Z1231 Encounter for screening mammogram for malignant neoplasm of breast: Secondary | ICD-10-CM | POA: Diagnosis not present

## 2020-11-19 DIAGNOSIS — F411 Generalized anxiety disorder: Secondary | ICD-10-CM | POA: Diagnosis not present

## 2020-11-19 DIAGNOSIS — Z6829 Body mass index (BMI) 29.0-29.9, adult: Secondary | ICD-10-CM | POA: Diagnosis not present

## 2020-11-19 DIAGNOSIS — N959 Unspecified menopausal and perimenopausal disorder: Secondary | ICD-10-CM | POA: Diagnosis not present

## 2020-11-23 ENCOUNTER — Other Ambulatory Visit: Payer: Self-pay | Admitting: Obstetrics and Gynecology

## 2020-11-23 DIAGNOSIS — R928 Other abnormal and inconclusive findings on diagnostic imaging of breast: Secondary | ICD-10-CM

## 2020-12-01 DIAGNOSIS — E663 Overweight: Secondary | ICD-10-CM | POA: Diagnosis not present

## 2020-12-01 DIAGNOSIS — Z6829 Body mass index (BMI) 29.0-29.9, adult: Secondary | ICD-10-CM | POA: Diagnosis not present

## 2020-12-01 DIAGNOSIS — M1991 Primary osteoarthritis, unspecified site: Secondary | ICD-10-CM | POA: Diagnosis not present

## 2020-12-01 DIAGNOSIS — I10 Essential (primary) hypertension: Secondary | ICD-10-CM | POA: Diagnosis not present

## 2020-12-17 ENCOUNTER — Ambulatory Visit
Admission: RE | Admit: 2020-12-17 | Discharge: 2020-12-17 | Disposition: A | Discharge status: Home / Self Care | Payer: BC Managed Care – PPO | Source: Ambulatory Visit | Attending: Obstetrics and Gynecology | Admitting: Obstetrics and Gynecology

## 2020-12-17 DIAGNOSIS — R928 Other abnormal and inconclusive findings on diagnostic imaging of breast: Secondary | ICD-10-CM

## 2020-12-17 DIAGNOSIS — N6002 Solitary cyst of left breast: Secondary | ICD-10-CM | POA: Diagnosis not present

## 2021-01-28 DIAGNOSIS — L821 Other seborrheic keratosis: Secondary | ICD-10-CM | POA: Diagnosis not present

## 2021-01-28 DIAGNOSIS — D225 Melanocytic nevi of trunk: Secondary | ICD-10-CM | POA: Diagnosis not present

## 2021-01-28 DIAGNOSIS — L814 Other melanin hyperpigmentation: Secondary | ICD-10-CM | POA: Diagnosis not present

## 2021-01-29 ENCOUNTER — Ambulatory Visit: Payer: BC Managed Care – PPO | Admitting: Cardiovascular Disease

## 2021-01-29 ENCOUNTER — Other Ambulatory Visit: Payer: Self-pay

## 2021-01-29 ENCOUNTER — Encounter: Payer: Self-pay | Admitting: Cardiovascular Disease

## 2021-01-29 VITALS — BP 132/68 | HR 48 | Ht 66.0 in | Wt 196.0 lb

## 2021-01-29 DIAGNOSIS — I10 Essential (primary) hypertension: Secondary | ICD-10-CM | POA: Diagnosis not present

## 2021-01-29 DIAGNOSIS — R002 Palpitations: Secondary | ICD-10-CM | POA: Diagnosis not present

## 2021-01-29 DIAGNOSIS — E669 Obesity, unspecified: Secondary | ICD-10-CM | POA: Diagnosis not present

## 2021-01-29 MED ORDER — LOSARTAN POTASSIUM 100 MG PO TABS: 100.0000 mg | ORAL_TABLET | Freq: Every day | ORAL | 3 refills | Status: DC

## 2021-01-29 NOTE — Progress Notes (Signed)
Cardiology Office Note:    Date:  01/29/2021   ID:  Wendy Rodriguez, DOB 1965-07-08, MRN [id number]  PCP:  Elfredia Nevins, MD  [facility] Cardiologist:  Thurmon Fair, MD  [facility] Electrophysiologist:  None   Referring MD: Elfredia Nevins, MD   Chief Complaint  Patient presents with   Palpitations         History of Present Illness:    Wendy Rodriguez is a 56 y.o. female with a hx of systemic hypertension and palpitations.  Her husband Wendy Rodriguez is my patient as well.  Her blood pressure is now well controlled on a combination of losartan 100 mg daily and hydrochlorothiazide 12.5 mg daily.  Amlodipine caused ankle edema and has been stopped.  Today her blood pressure is 132/68 and it is usually lower than that at home.  She has occasional random palpitations that are not associated with physical activity.  They do not associate dizziness, syncope, chest pain or shortness of breath.  She denies exertional angina or dyspnea.  She has never experienced syncope.  She tends to have a slow heart rate in today she has sinus bradycardia with a rate of 48 bpm.  She has not had lower extremity edema or focal neurological events or claudication.  She is working on losing weight.  When she was married she weighed only 115 pounds.  She underwent total hysterectomy in 2018 and has been on estradiol supplements since.  She had a recent bone scan that showed normal bone density.  She has had problems with her lower back and knees for which she has received several steroid injections and has also received oral Medrol Dosepaks.  She has a history of esophageal reflux disease, symptoms well controlled with omeprazole.    Past Medical History:  Diagnosis Date   GERD (gastroesophageal reflux disease)    Hypertension    Osteoarthritis    Past Surgical History:  Procedure Laterality Date   ABDOMINAL HYSTERECTOMY      Current Medications: Current Meds  Medication Sig   Cholecalciferol (VITAMIN D3  PO) Vitamin D3   escitalopram (LEXAPRO) 10 MG tablet escitalopram 10 mg tablet  TAKE 1 TABLET BY MOUTH EVERY DAY   estradiol (ESTRACE) 2 MG tablet    famotidine (PEPCID) 40 MG tablet famotidine 40 mg tablet   hydrochlorothiazide (MICROZIDE) 12.5 MG capsule    Multiple Vitamins-Minerals (MULTIVITAMIN WOMEN) TABS    [DISCONTINUED] losartan (COZAAR) 100 MG tablet Take 1 tablet (100 mg total) by mouth daily.     Allergies:   Sulfa antibiotics   Social History   Socioeconomic History   Marital status: Married    Spouse name: Not on file   Number of children: Not on file   Years of education: Not on file   Highest education level: Not on file  Occupational History   Not on file  Tobacco Use   Smoking status: Never   Smokeless tobacco: Never  Substance and Sexual Activity   Alcohol use: Not Currently   Drug use: Never   Sexual activity: Not on file  Other Topics Concern   Not on file  Social History Narrative   Not on file   Social Determinants of Health   Financial Resource Strain: Not on file  Food Insecurity: Not on file  Transportation Needs: Not on file  Physical Activity: Not on file  Stress: Not on file  Social Connections: Not on file     Family History: The patient's family history includes  Cirrhosis in her maternal grandfather; Diabetes in her father and paternal grandmother; Heart disease in her paternal grandmother; Hyperlipidemia in her father; Hypertension in her father and mother; Throat cancer in her maternal grandmother; Thyroid disease in her mother.  ROS:   Please see the history of present illness.     All other systems reviewed and are negative.  EKGs/Labs/Other Studies Reviewed:    The following studies were reviewed today: 11/10/2020 labs from PCP Cholesterol 178, triglycerides 128, HDL 57, LDL 98 Hemoglobin W2NA1c 5.5%, TSH 3.320, creatinine 1.0, hemoglobin 12.3, normal liver function tests  EKG:  EKG is ordered today.  Personally reviewed,  shows sinus bradycardia at 40 bpm but is otherwise a completely normal tracing.  Uncorrected QT 462 ms  Recent Labs: No results found for requested labs within last 8760 hours.  11/04/2019 Creatinine 0.67, hemoglobin 12.6, TSH 2.82, hemoglobin A1c 5.3% 11/10/2020  Hemoglobin A1c 5.5%, TSH 3.320, creatinine 1.0, hemoglobin 12.3, normal liver function tests Recent Lipid Panel No results found for: CHOL, TRIG, HDL, CHOLHDL, VLDL, LDLCALC, LDLDIRECT 11/04/2019 Total cholesterol 189, HDL 66, LDL 107, triglycerides 91 56/21/308611/01/2020  Cholesterol 178, triglycerides 128, HDL 57, LDL 98  Risk Assessment/Calculations:       Physical Exam:    VS:  BP 132/68    Pulse (!) 48    Ht 5\' 6"  (1.676 m)    Wt 196 lb (88.9 kg)    LMP 09/02/2011    SpO2 99%    BMI 31.64 kg/m     Wt Readings from Last 3 Encounters:  01/29/21 196 lb (88.9 kg)  01/24/20 190 lb 6.4 oz (86.4 kg)     General: Alert, oriented x3, no distress, mildly obese Head: no evidence of trauma, PERRL, EOMI, no exophtalmos or lid lag, no myxedema, no xanthelasma; normal ears, nose and oropharynx Neck: normal jugular venous pulsations and no hepatojugular reflux; brisk carotid pulses without delay and no carotid bruits Chest: clear to auscultation, no signs of consolidation by percussion or palpation, normal fremitus, symmetrical and full respiratory excursions Cardiovascular: normal position and quality of the apical impulse, regular rhythm, normal first and second heart sounds, no murmurs, rubs or gallops Abdomen: no tenderness or distention, no masses by palpation, no abnormal pulsatility or arterial bruits, normal bowel sounds, no hepatosplenomegaly Extremities: no clubbing, cyanosis or edema; 2+ radial, ulnar and brachial pulses bilaterally; 2+ right femoral, posterior tibial and dorsalis pedis pulses; 2+ left femoral, posterior tibial and dorsalis pedis pulses; no subclavian or femoral bruits Neurological: grossly nonfocal Psych:  Normal mood and affect   ASSESSMENT:    1. Essential hypertension   2. Palpitations   3. Mild obesity     PLAN:    In order of problems listed above:  HTN: Now well controlled on combination ARB plus diuretic.  Calcium channel blockers caused ankle edema.  Suspect that with substantial weight loss she should be able to come off at least 1 if not both these medications. Palpitations: This sounds like isolated PACs or PVCs.  She does not have known structural heart disease.  I would not recommend empirical treatment with beta-blocker since she has resting bradycardia. Mild obesity: Discussed ways to lose weight by increasing physical activity especially by restricting the intake of calories, especially simple carbohydrates and starches with high glycemic index, increasing the intake of lean protein and unsaturated fat.        Medication Adjustments/Labs and Tests Ordered: Current medicines are reviewed at length with the patient today.  Concerns  regarding medicines are outlined above.  Orders Placed This Encounter  Procedures   EKG 12-Lead   Meds ordered this encounter  Medications   losartan (COZAAR) 100 MG tablet    Sig: Take 1 tablet (100 mg total) by mouth daily.    Dispense:  90 tablet    Refill:  3    Patient Instructions  Medication Instructions:  No changes *If you need a refill on your cardiac medications before your next appointment, please call your pharmacy*   Lab Work: None ordered If you have labs (blood work) drawn today and your tests are completely normal, you will receive your results only by: MyChart Message (if you have MyChart) OR A paper copy in the mail If you have any lab test that is abnormal or we need to change your treatment, we will call you to review the results.   Testing/Procedures: None ordered   Follow-Up: At [facility], you and your health needs are our priority.  As part of our continuing mission to provide you with  exceptional heart care, we have created designated Provider Care Teams.  These Care Teams include your primary Cardiologist (physician) and Advanced Practice Providers (APPs -  Physician Assistants and Nurse Practitioners) who all work together to provide you with the care you need, when you need it.  We recommend signing up for the patient portal called "MyChart".  Sign up information is provided on this After Visit Summary.  MyChart is used to connect with patients for Virtual Visits (Telemedicine).  Patients are able to view lab/test results, encounter notes, upcoming appointments, etc.  Non-urgent messages can be sent to your provider as well.   To learn more about what you can do with MyChart, go to [email or web address].    Your next appointment:   12 month(s)  The format for your next appointment:   In Person  Provider:   Thurmon Fair, MD     Other Instructions  [facility] [employer]: Website: [email or web address]  DR. Royann Shivers RECOMMENDS YOU PURCHASE  " Kardia" [employer] FROM THE  GOOGLE[employer]  APP PLAY STORE.  THE APP IS FREE , BUT THE  EQUIPMENT HAS A COST. IT ALLOWS YOU TO OBTAIN A RECORDING OF YOUR HEART RATE AND RHYTHM BY PROVIDING A SHORT STRIP THAT YOU CAN SHARE WITH YOUR PROVIDER.       Signed, Thurmon Fair, MD  01/29/2021 2:10 PM     [facility]

## 2021-01-29 NOTE — Patient Instructions (Signed)
Medication Instructions:  No changes *If you need a refill on your cardiac medications before your next appointment, please call your pharmacy*   Lab Work: None ordered If you have labs (blood work) drawn today and your tests are completely normal, you will receive your results only by: MyChart Message (if you have MyChart) OR A paper copy in the mail If you have any lab test that is abnormal or we need to change your treatment, we will call you to review the results.   Testing/Procedures: None ordered   Follow-Up: At [facility], you and your health needs are our priority.  As part of our continuing mission to provide you with exceptional heart care, we have created designated Provider Care Teams.  These Care Teams include your primary Cardiologist (physician) and Advanced Practice Providers (APPs -  Physician Assistants and Nurse Practitioners) who all work together to provide you with the care you need, when you need it.  We recommend signing up for the patient portal called "MyChart".  Sign up information is provided on this After Visit Summary.  MyChart is used to connect with patients for Virtual Visits (Telemedicine).  Patients are able to view lab/test results, encounter notes, upcoming appointments, etc.  Non-urgent messages can be sent to your provider as well.   To learn more about what you can do with MyChart, go to [email or web address].    Your next appointment:   12 month(s)  The format for your next appointment:   In Person  Provider:   Thurmon Fair, MD     Other Instructions  [facility] [employer]: Website: [email or web address]  DR. Royann Shivers RECOMMENDS YOU PURCHASE  " Kardia" [employer]  [employer] FROM THE  G[employer]  APP PLAY STORE.  THE APP IS FREE , BUT THE  EQUIPMENT HAS A COST. IT ALLOWS YOU TO OBTAIN A RECORDING OF YOUR HEART RATE AND RHYTHM BY PROVIDING A SHORT STRIP THAT YOU CAN SHARE WITH YOUR PROVIDER.

## 2021-04-03 IMAGING — MR MR LUMBAR SPINE W/O CM
4 of 5 series · 18 of 48 positions shown · non-contrast
Comparison: 04/14/2016

CLINICAL DATA: Low back pain radiating down the left leg for 15
years

EXAM:
MRI LUMBAR SPINE WITHOUT CONTRAST
TECHNIQUE: Multiplanar, multisequence MR imaging of the lumbar spine was
performed. No intravenous contrast was administered.

[Series 5: T2 · sagittal · 4.0mm · 0.73mm/px · 6 of 15 slices shown (1 of 2)]
[im 1/15]
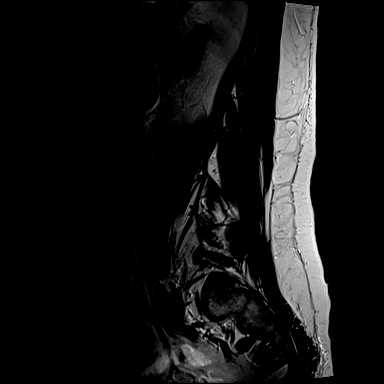
[im 3/15]
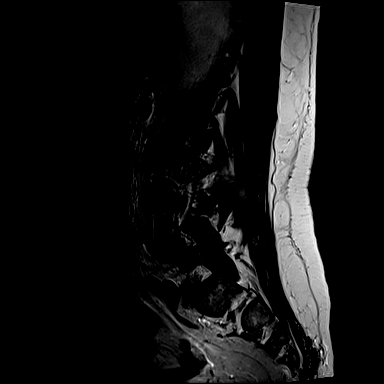
[im 6/15]
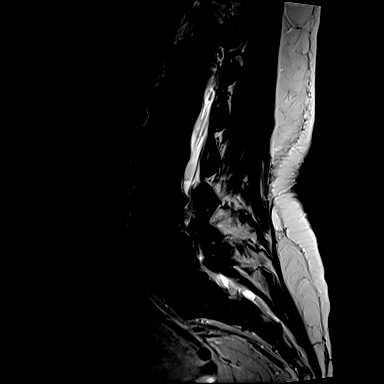
[im 9/15]
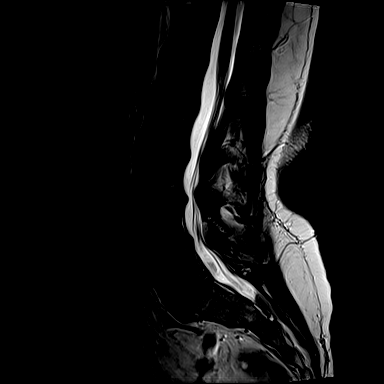
[im 12/15]
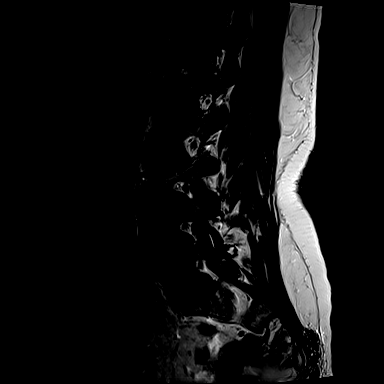
[im 15/15]
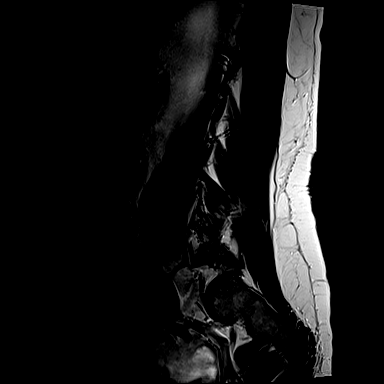

[Series 6: T1 · sagittal · 4.0mm · 0.73mm/px · 3 of 15 slices shown (1 of 2)]
[im 1/15]
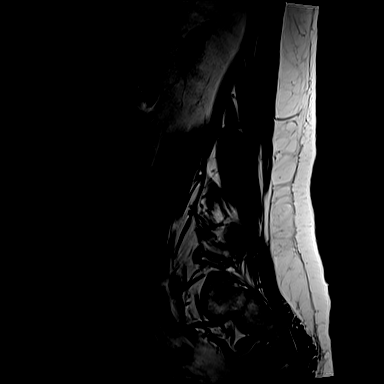
[im 8/15]
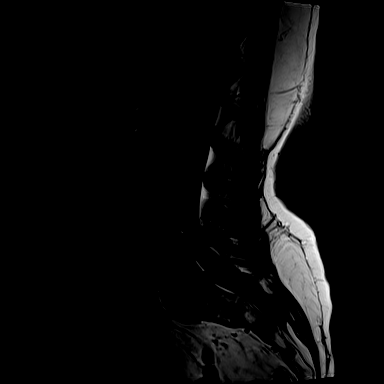
[im 15/15]
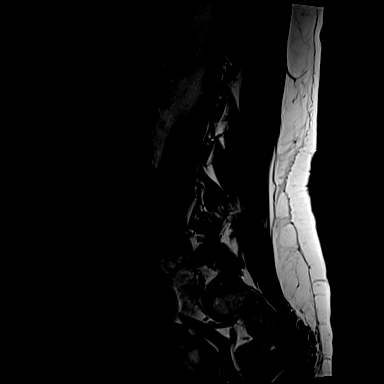

[Series 12: T2 · axial · 4.0mm · 0.28mm/px · z∈[-20,+175]mm · 6 of 43 slices shown (2 of 2)]
[im 3/43]
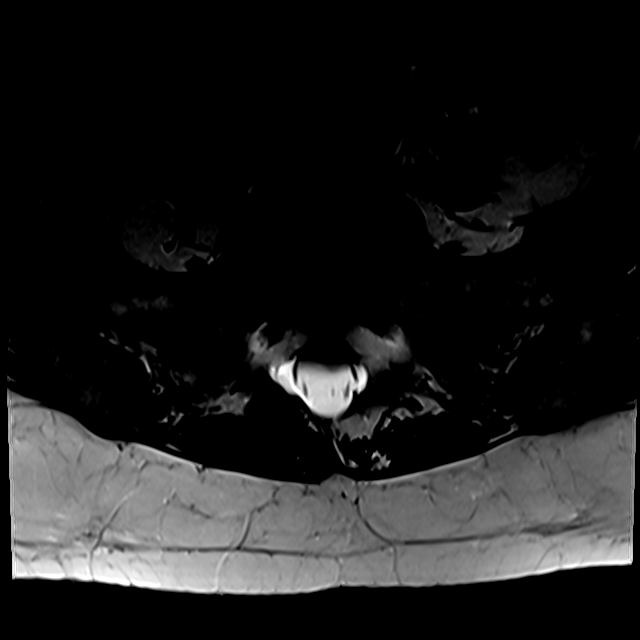
[im 6/43]
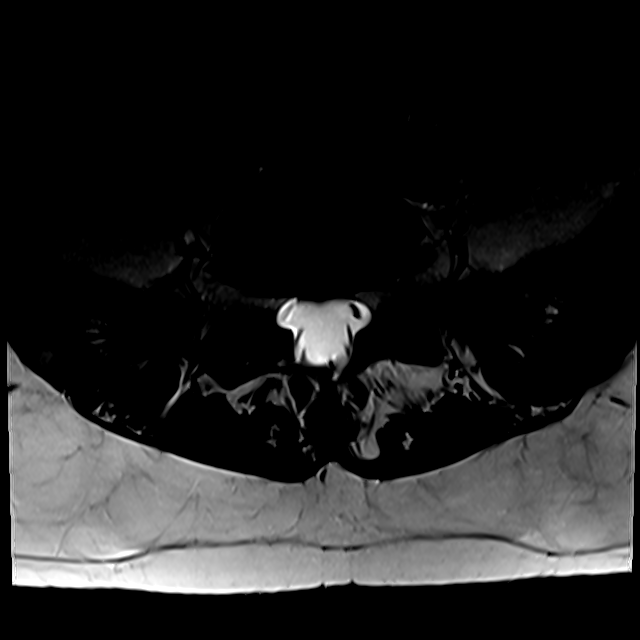
[im 9/43]
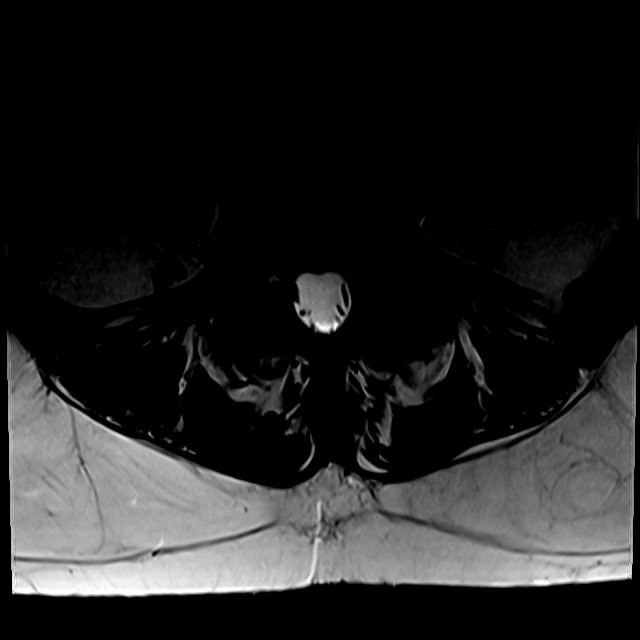
[im 15/43]
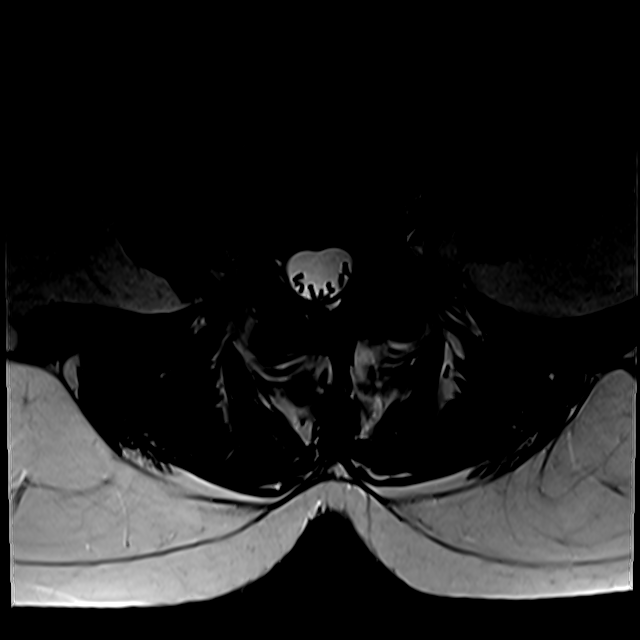
[im 23/43]
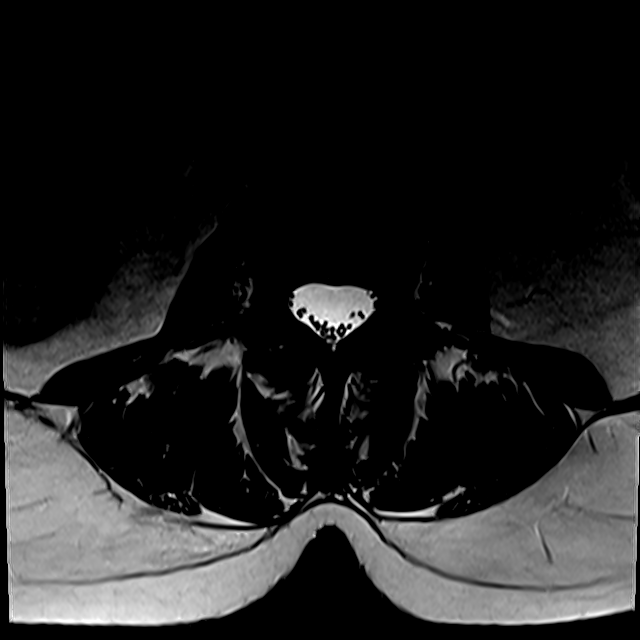
[im 37/43]
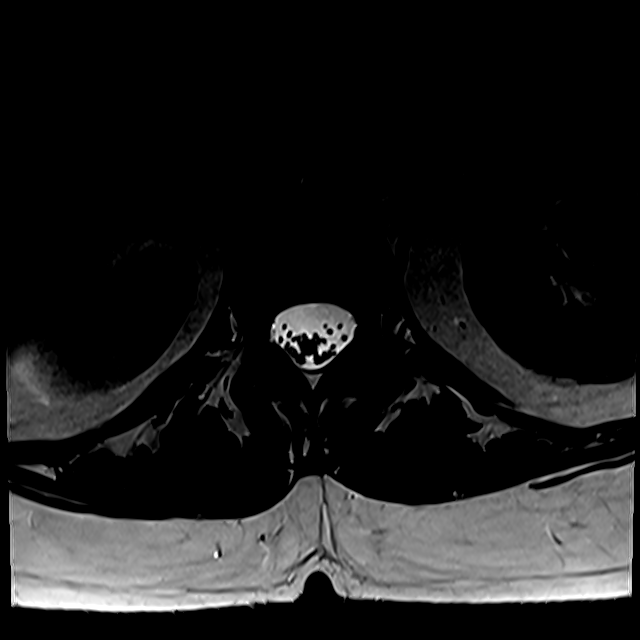

[Series 100: T1 · axial · 4.0mm · 0.28mm/px · z∈[-5,+175]mm · 3 of 43 slices shown (2 of 2)]
[im 6/43]
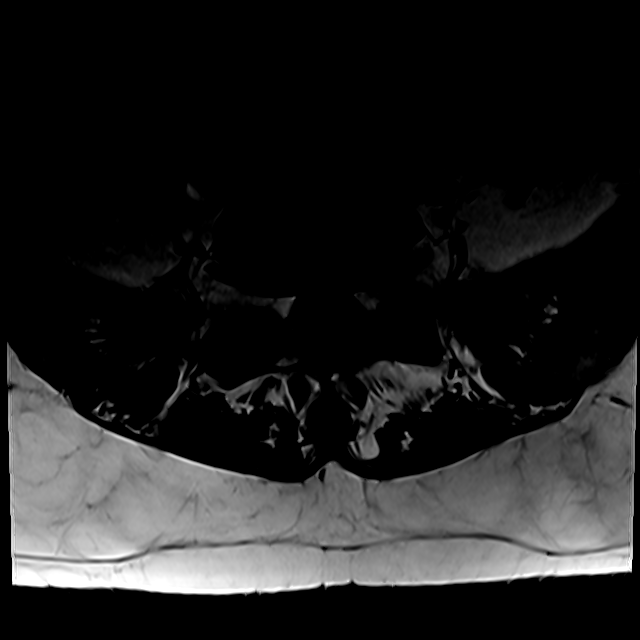
[im 23/43]
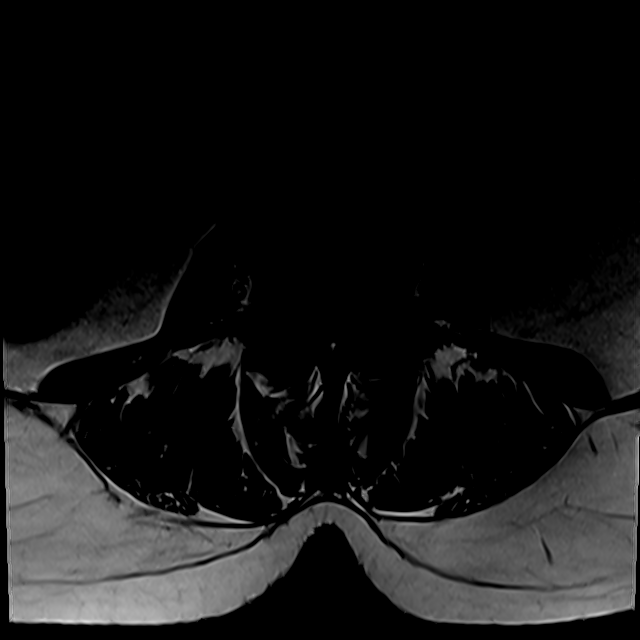
[im 37/43]
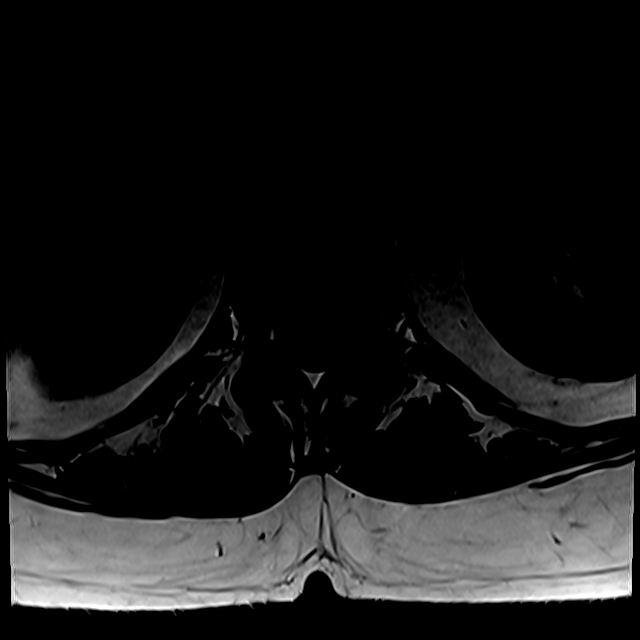

[18 of 48 positions shown; findings below may reference images not displayed]

FINDINGS: Segmentation:  Standard.

Alignment:  Grade 1 anterolisthesis at L4-5, facet mediated

Vertebrae: No fracture, evidence of discitis, or bone lesion. The
pars of L5 on both sides are difficult to visualize. No pars defects
clearly seen on prior and there is no anterolisthesis at L5-S1.

Conus medullaris and cauda equina: Conus extends to the L1 level.
Conus and cauda equina appear normal.

Paraspinal and other soft tissues: Negative

Disc levels:

T12- L1: Unremarkable.

L1-L2: Mild ventral spondylitic spurring

L2-L3: Mild disc narrowing and bulging with an inferior foraminal
protrusion on the left that is small and noncompressive

L3-L4: Greatest level of disc narrowing with endplate degeneration
and right preferential bulge/endplate spurring. Mild facet spurring.
Patent canal and foramina

L4-L5: Facet osteoarthritis with joint effusions, spurring, and
posterior synovial cysts. Mild periarticular edema. Mild
anterolisthesis. The disc is bulging. Mild spinal stenosis with
triangular narrowing of the thecal sac.

L5-S1:Mild facet spurring. Small central protrusion/annular fissure.
No neural impingement
IMPRESSION: 1. No notable change when compared to 6110.
2. Facet osteoarthritis especially at L4-5 where there is
periarticular edema and posterior synovial cysts. Mild L4-5
anterolisthesis.
3. Diffusely patent spinal canal.

## 2021-09-30 DIAGNOSIS — Z13228 Encounter for screening for other metabolic disorders: Secondary | ICD-10-CM | POA: Diagnosis not present

## 2021-09-30 DIAGNOSIS — Z131 Encounter for screening for diabetes mellitus: Secondary | ICD-10-CM | POA: Diagnosis not present

## 2021-09-30 DIAGNOSIS — Z1321 Encounter for screening for nutritional disorder: Secondary | ICD-10-CM | POA: Diagnosis not present

## 2021-09-30 DIAGNOSIS — Z1329 Encounter for screening for other suspected endocrine disorder: Secondary | ICD-10-CM | POA: Diagnosis not present

## 2021-10-14 DIAGNOSIS — Z6830 Body mass index (BMI) 30.0-30.9, adult: Secondary | ICD-10-CM | POA: Diagnosis not present

## 2021-10-14 DIAGNOSIS — Z1231 Encounter for screening mammogram for malignant neoplasm of breast: Secondary | ICD-10-CM | POA: Diagnosis not present

## 2021-10-14 DIAGNOSIS — Z01419 Encounter for gynecological examination (general) (routine) without abnormal findings: Secondary | ICD-10-CM | POA: Diagnosis not present

## 2021-11-23 DIAGNOSIS — M1991 Primary osteoarthritis, unspecified site: Secondary | ICD-10-CM | POA: Diagnosis not present

## 2021-11-23 DIAGNOSIS — E6609 Other obesity due to excess calories: Secondary | ICD-10-CM | POA: Diagnosis not present

## 2021-11-23 DIAGNOSIS — J042 Acute laryngotracheitis: Secondary | ICD-10-CM | POA: Diagnosis not present

## 2021-11-23 DIAGNOSIS — J0100 Acute maxillary sinusitis, unspecified: Secondary | ICD-10-CM | POA: Diagnosis not present

## 2021-11-23 DIAGNOSIS — Z6830 Body mass index (BMI) 30.0-30.9, adult: Secondary | ICD-10-CM | POA: Diagnosis not present

## 2021-11-23 DIAGNOSIS — I10 Essential (primary) hypertension: Secondary | ICD-10-CM | POA: Diagnosis not present

## 2021-12-08 ENCOUNTER — Ambulatory Visit: Payer: BC Managed Care – PPO | Attending: Cardiovascular Disease | Admitting: Cardiovascular Disease

## 2021-12-08 ENCOUNTER — Encounter: Payer: Self-pay | Admitting: Cardiovascular Disease

## 2021-12-08 VITALS — BP 149/77 | HR 47 | Ht 66.0 in | Wt 201.2 lb

## 2021-12-08 DIAGNOSIS — I10 Essential (primary) hypertension: Secondary | ICD-10-CM

## 2021-12-08 DIAGNOSIS — E669 Obesity, unspecified: Secondary | ICD-10-CM

## 2021-12-08 DIAGNOSIS — R002 Palpitations: Secondary | ICD-10-CM | POA: Diagnosis not present

## 2021-12-08 MED ORDER — LOSARTAN POTASSIUM-HCTZ 100-25 MG PO TABS: 1.0000 | ORAL_TABLET | Freq: Every day | ORAL | 3 refills | Status: DC

## 2021-12-08 NOTE — Progress Notes (Addendum)
Cardiology Office Note:    Date:  12/08/2021   ID:  Wendy Rodriguez, DOB 01/09/1966, MRN [id number]  PCP:  Elfredia Nevins, MD  [facility] Cardiologist:  Thurmon Fair, MD  [facility] Electrophysiologist:  None   Referring MD: Elfredia Nevins, MD   Chief Complaint  Patient presents with   Hypertension    History of Present Illness:    Wendy Rodriguez is a 56 y.o. female with a hx of systemic hypertension and palpitations.  Her husband Wendy Rodriguez is my patient as well.  Wendy Rodriguez has been busy babysitting her grandchildren and has been less attention to her own health.  She has not been checking her blood pressure at home.  She has gained substantial weight.  Today her blood pressure was elevated and remained elevated hemoglobin rechecked it after 15 minutes at rest.  She has not been getting regular exercise.  Otherwise she feels well.  She denies palpitations dizziness syncope chest pain shortness of breath at rest or with activity.  She has not had leg edema, although this was a problem in the past when she took amlodipine.  As before she has marked sinus bradycardia in the 40s but is asymptomatic.   When she was married she weighed only 115 pounds.  She underwent total hysterectomy in 2018 and has been on estradiol supplements since.  She had a recent bone scan that showed normal bone density.  She has had problems with her lower back and knees for which she has received several steroid injections and has also received oral Medrol Dosepaks.  She has a history of esophageal reflux disease, symptoms well controlled with omeprazole.    Past Medical History:  Diagnosis Date   GERD (gastroesophageal reflux disease)    Hypertension    Osteoarthritis    Past Surgical History:  Procedure Laterality Date   ABDOMINAL HYSTERECTOMY      Current Medications: Current Meds  Medication Sig   Cholecalciferol (VITAMIN D3 PO) Vitamin D3   escitalopram (LEXAPRO) 10 MG tablet escitalopram 10 mg  tablet  TAKE 1 TABLET BY MOUTH EVERY DAY   estradiol (ESTRACE) 2 MG tablet Take 1 tablet by mouth daily.   losartan-hydrochlorothiazide (HYZAAR) 100-25 MG tablet Take 1 tablet by mouth daily.   Multiple Vitamins-Minerals (MULTIVITAMIN WOMEN) TABS    vitamin C (ASCORBIC ACID) 250 MG tablet Take 250 mg by mouth daily.   [DISCONTINUED] estradiol (ESTRACE) 2 MG tablet    [DISCONTINUED] hydrochlorothiazide (MICROZIDE) 12.5 MG capsule Take 1 tablet by mouth daily.   [DISCONTINUED] losartan (COZAAR) 100 MG tablet Take 1 tablet (100 mg total) by mouth daily.     Allergies:   Sulfa antibiotics   Social History   Socioeconomic History   Marital status: Married    Spouse name: Not on file   Number of children: Not on file   Years of education: Not on file   Highest education level: Not on file  Occupational History   Not on file  Tobacco Use   Smoking status: Never   Smokeless tobacco: Never  Substance and Sexual Activity   Alcohol use: Not Currently   Drug use: Never   Sexual activity: Not on file  Other Topics Concern   Not on file  Social History Narrative   Not on file   Social Determinants of Health   Financial Resource Strain: Not on file  Food Insecurity: Not on file  Transportation Needs: Not on file  Physical Activity: Not on file  Stress:  Not on file  Social Connections: Not on file     Family History: The patient's family history includes Cirrhosis in her maternal grandfather; Diabetes in her father and paternal grandmother; Heart disease in her paternal grandmother; Hyperlipidemia in her father; Hypertension in her father and mother; Throat cancer in her maternal grandmother; Thyroid disease in her mother.  ROS:   Please see the history of present illness.     All other systems reviewed and are negative.  EKGs/Labs/Other Studies Reviewed:    The following studies were reviewed today: 11/10/2020 labs from PCP Cholesterol 178, triglycerides 128, HDL 57, LDL  98 Hemoglobin A1c 5.5%, TSH 3.320, creatinine 1.0, hemoglobin 12.3, normal liver function tests  09/30/2021 Cholesterol 177, HDL 54, LDL 97, triglycerides 150 Hemoglobin A1c 5.5% Hemoglobin 12.6, creatinine 0.1, potassium 4.4, ALT 33, TSH 2.1  EKG:  EKG is ordered today.  Shows marked sinus bradycardia but is otherwise normal.  Uncorrected QT 488 ms.  Recent Labs: No results found for requested labs within last 365 days.   Recent Lipid Panel No results found for: "CHOL", "TRIG", "HDL", "CHOLHDL", "VLDL", "LDLCALC", "LDLDIRECT"  Risk Assessment/Calculations:       Physical Exam:    VS:  BP (!) 149/77   Pulse (!) 47   Ht 5\' 6"  (1.676 m)   Wt 91.3 kg   LMP 09/02/2011   SpO2 96%   BMI 32.47 kg/m     Wt Readings from Last 3 Encounters:  12/08/21 91.3 kg  01/29/21 88.9 kg  01/24/20 86.4 kg     General: Alert, oriented x3, no distress, mildly obese Head: no evidence of trauma, PERRL, EOMI, no exophtalmos or lid lag, no myxedema, no xanthelasma; normal ears, nose and oropharynx Neck: normal jugular venous pulsations and no hepatojugular reflux; brisk carotid pulses without delay and no carotid bruits Chest: clear to auscultation, no signs of consolidation by percussion or palpation, normal fremitus, symmetrical and full respiratory excursions Cardiovascular: normal position and quality of the apical impulse, regular rhythm, normal first and second heart sounds, no murmurs, rubs or gallops Abdomen: no tenderness or distention, no masses by palpation, no abnormal pulsatility or arterial bruits, normal bowel sounds, no hepatosplenomegaly Extremities: no clubbing, cyanosis or edema; 2+ radial, ulnar and brachial pulses bilaterally; 2+ right femoral, posterior tibial and dorsalis pedis pulses; 2+ left femoral, posterior tibial and dorsalis pedis pulses; no subclavian or femoral bruits Neurological: grossly nonfocal Psych: Normal mood and affect  HYPERTENSION CONTROL Vitals:    12/08/21 0820 12/08/21 0907  BP: (!) 164/86 (!) 149/77    The patient's blood pressure is elevated above target today.  In order to address the patient's elevated BP: A current anti-hypertensive medication was adjusted today.       ASSESSMENT:    1. Essential hypertension   2. Palpitations   3. Mild obesity      PLAN:    In order of problems listed above:  HTN: Not well-controlled, possibly due to weight gain.  Increase hydrochlorothiazide to 25 mg daily.  Switch to combination losartan HCT 100/25.  Encouraged avoiding high sodium foods, regular exercise, weight loss. Palpitations: Currently not bothering her much.  Avoid beta-blockers due to resting bradycardia. Mild obesity: Reviewed important dietary changes and regular exercise to lose weight.        Medication Adjustments/Labs and Tests Ordered: Current medicines are reviewed at length with the patient today.  Concerns regarding medicines are outlined above.  Orders Placed This Encounter  Procedures   EKG 12-Lead  Meds ordered this encounter  Medications   losartan-hydrochlorothiazide (HYZAAR) 100-25 MG tablet    Sig: Take 1 tablet by mouth daily.    Dispense:  90 tablet    Refill:  3    Patient Instructions  Medication Instructions:  START Losartan-HCTZ to 25 mg daily (you may increase the HCTZ that you have at home take 2 tablets) then start the new medication.  *If you need a refill on your cardiac medications before your next appointment, please call your pharmacy*  Follow-Up: At [facility], you and your health needs are our priority.  As part of our continuing mission to provide you with exceptional heart care, we have created designated Provider Care Teams.  These Care Teams include your primary Cardiologist (physician) and Advanced Practice Providers (APPs -  Physician Assistants and Nurse Practitioners) who all work together to provide you with the care you need, when you need it.  We  recommend signing up for the patient portal called "MyChart".  Sign up information is provided on this After Visit Summary.  MyChart is used to connect with patients for Virtual Visits (Telemedicine).  Patients are able to view lab/test results, encounter notes, upcoming appointments, etc.  Non-urgent messages can be sent to your provider as well.   To learn more about what you can do with MyChart, go to [email or web address].    Your next appointment:   12 month(s)  The format for your next appointment:   In Person  Provider:   Thurmon Fair, MD            Signed, Thurmon Fair, MD  12/08/2021 9:08 AM    [facility]

## 2021-12-08 NOTE — Patient Instructions (Addendum)
Medication Instructions:  START Losartan-HCTZ to 25 mg daily (you may increase the HCTZ that you have at home take 2 tablets) then start the new medication.  *If you need a refill on your cardiac medications before your next appointment, please call your pharmacy*  Follow-Up: At [facility], you and your health needs are our priority.  As part of our continuing mission to provide you with exceptional heart care, we have created designated Provider Care Teams.  These Care Teams include your primary Cardiologist (physician) and Advanced Practice Providers (APPs -  Physician Assistants and Nurse Practitioners) who all work together to provide you with the care you need, when you need it.  We recommend signing up for the patient portal called "MyChart".  Sign up information is provided on this After Visit Summary.  MyChart is used to connect with patients for Virtual Visits (Telemedicine).  Patients are able to view lab/test results, encounter notes, upcoming appointments, etc.  Non-urgent messages can be sent to your provider as well.   To learn more about what you can do with MyChart, go to [email or web address].    Your next appointment:   12 month(s)  The format for your next appointment:   In Person  Provider:   Thurmon Fair, MD

## 2021-12-20 DIAGNOSIS — B078 Other viral warts: Secondary | ICD-10-CM | POA: Diagnosis not present

## 2021-12-20 DIAGNOSIS — L821 Other seborrheic keratosis: Secondary | ICD-10-CM | POA: Diagnosis not present

## 2021-12-20 DIAGNOSIS — D2339 Other benign neoplasm of skin of other parts of face: Secondary | ICD-10-CM | POA: Diagnosis not present

## 2021-12-20 DIAGNOSIS — L820 Inflamed seborrheic keratosis: Secondary | ICD-10-CM | POA: Diagnosis not present

## 2022-09-30 LAB — CMP 10231: EGFR: 93

## 2022-09-30 LAB — HEMOGLOBIN A1C: A1c: 5.6

## 2022-11-24 ENCOUNTER — Other Ambulatory Visit: Payer: Self-pay

## 2022-11-24 MED ORDER — LOSARTAN POTASSIUM-HCTZ 100-25 MG PO TABS: 1.0000 | ORAL_TABLET | Freq: Every day | ORAL | 0 refills | Status: DC

## 2022-12-26 NOTE — Progress Notes (Unsigned)
Cardiology Clinic Note   Patient Name: Wendy Rodriguez Date of Encounter: 12/29/2022  Primary Care Provider:  Elfredia Nevins, MD Primary Cardiologist:  Thurmon Fair, MD  Patient Profile    Wendy Rodriguez 57 year old female presents to the clinic today for follow-up evaluation of her essential hypertension and palpitations.  Past Medical History    Past Medical History:  Diagnosis Date   GERD (gastroesophageal reflux disease)    Hypertension    Osteoarthritis    Past Surgical History:  Procedure Laterality Date   ABDOMINAL HYSTERECTOMY      Allergies  Allergies  Allergen Reactions   Sulfa Antibiotics Rash    History of Present Illness    Wendy Rodriguez has a PMH of HTN, palpitations, GERD, and mild obesity.  She did not tolerate amlodipine due to lower extremity swelling.  Her PMH also includes sinus bradycardia with heart rates in the 40s.  She underwent total hysterectomy in 2018 and was placed on hormone replacement therapy.  She had bone scan which showed normal bone density.  She also has orthopedic issues with her low back and knees.  She receives steroid injections and oral Medrol Dosepak's.  She was seen in the cardiology clinic by Dr. Royann Shivers on 12/08/2021.  She presented with her husband who is also Dr. Erin Hearing patient.  She reported that she had been busy taking care of her grandchildren and was pain less attention to her health.  She was not checking her blood pressure regularly at home.  She had gained weight.  Her blood pressure was noted to be elevated.  She reported getting regular exercise.  She felt well.  She denied palpitations and dizziness.  She denied lower extremity swelling.  She was placed on losartan, HCTZ.  She presents to the clinic today for follow-up evaluation and states she had a respiratory infection over the Thanksgiving holidays.  She brings in lab work today that shows slightly elevated liver panel.  We reviewed her Lexapro and Tylenol  use.  She does not drink alcohol.  She maintains her physical activity watching her grandchildren.  She denies chest discomfort.  Her blood pressure initially today is 164/86 on recheck is 126/62.  I recommended that she continue to monitor her blood pressure at home.  She reports that while she was having her blood pressure taken she did have a hot flash.  We reviewed her EKG.  Will plan follow-up in 9 to 12 months.  I will also refill her Hyzaar and prescribed Protonix.  I gave the GERD diet instructions as well..  Today she denies chest pain, shortness of breath, lower extremity edema, fatigue, palpitations, melena, hematuria, hemoptysis, diaphoresis, weakness, presyncope, syncope, orthopnea, and PND.    Home Medications    Prior to Admission medications   Medication Sig Start Date End Date Taking? Authorizing Provider  Cholecalciferol (VITAMIN D3 PO) Vitamin D3    [provider]  escitalopram (LEXAPRO) 10 MG tablet escitalopram 10 mg tablet  TAKE 1 TABLET BY MOUTH EVERY DAY    [provider]  estradiol (ESTRACE) 2 MG tablet Take 1 tablet by mouth daily. 11/20/20   [provider]  famotidine (PEPCID) 40 MG tablet famotidine 40 mg tablet Patient not taking: Reported on 12/08/2021 11/20/19   [provider]  losartan-hydrochlorothiazide (HYZAAR) 100-25 MG tablet Take 1 tablet by mouth daily. 11/24/22   Croitoru, Rachelle Hora, MD  Multiple Vitamins-Minerals (MULTIVITAMIN WOMEN) TABS     [provider]  vitamin  C (ASCORBIC ACID) 250 MG tablet Take 250 mg by mouth daily.    [provider]    Family History    Family History  Problem Relation Age of Onset   Hypertension Mother    Thyroid disease Mother    Hypertension Father    Hyperlipidemia Father    Diabetes Father    Throat cancer Maternal Grandmother    Cirrhosis Maternal Grandfather    Diabetes Paternal Grandmother    Heart disease Paternal Grandmother    She indicated that her  mother is alive. She indicated that her father is alive. She indicated that her sister is alive. She indicated that her brother is alive. She indicated that her maternal grandmother is deceased. She indicated that her maternal grandfather is deceased. She indicated that her paternal grandmother is deceased. She indicated that her paternal grandfather is deceased.  Social History    Social History   Socioeconomic History   Marital status: Married    Spouse name: Not on file   Number of children: Not on file   Years of education: Not on file   Highest education level: Not on file  Occupational History   Not on file  Tobacco Use   Smoking status: Never   Smokeless tobacco: Never  Substance and Sexual Activity   Alcohol use: Not Currently   Drug use: Never   Sexual activity: Not on file  Other Topics Concern   Not on file  Social History Narrative   Not on file   Social Drivers of Health   Financial Resource Strain: Not on file  Food Insecurity: Not on file  Transportation Needs: Not on file  Physical Activity: Not on file  Stress: Not on file  Social Connections: Not on file  Intimate Partner Violence: Not on file     Review of Systems    General:  No chills, fever, night sweats or weight changes.  Cardiovascular:  No chest pain, dyspnea on exertion, edema, orthopnea, palpitations, paroxysmal nocturnal dyspnea. Dermatological: No rash, lesions/masses Respiratory: No cough, dyspnea Urologic: No hematuria, dysuria Abdominal:   No nausea, vomiting, diarrhea, bright red blood per rectum, melena, or hematemesis Neurologic:  No visual changes, wkns, changes in mental status. All other systems reviewed and are otherwise negative except as noted above.  Physical Exam    VS:  BP 126/62   Pulse (!) 54   Wt 201 lb (91.2 kg)   LMP 09/02/2011   SpO2 97%   BMI 32.44 kg/m  , BMI Body mass index is 32.44 kg/m. GEN: Well nourished, well developed, in no acute distress. HEENT:  normal. Neck: Supple, no JVD, carotid bruits, or masses. Cardiac: RRR, no murmurs, rubs, or gallops. No clubbing, cyanosis, edema.  Radials/DP/PT 2+ and equal bilaterally.  Respiratory:  Respirations regular and unlabored, clear to auscultation bilaterally. GI: Soft, nontender, nondistended, BS + x 4. MS: no deformity or atrophy. Skin: warm and dry, no rash. Neuro:  Strength and sensation are intact. Psych: Normal affect.  Accessory Clinical Findings    Recent Labs: No results found for requested labs within last 365 days.   Recent Lipid Panel No results found for: "CHOL", "TRIG", "HDL", "CHOLHDL", "VLDL", "LDLCALC", "LDLDIRECT"       ECG personally reviewed by me today- EKG Interpretation Date/Time:  Thursday December 29 2022 10:28:55 EST Ventricular Rate:  54 PR Interval:  144 QRS Duration:  88 QT Interval:  430 QTC Calculation: 407 R Axis:   7  Text Interpretation: Sinus  bradycardia Confirmed by Edd Fabian [phone]) on 12/29/2022 10:37:02 AM        Assessment & Plan   1.  Essential hypertension-BP today 126/62. Maintain blood pressure log Heart healthy low-sodium diet Continue weight loss Continue losartan, hydrochlorothiazide  Palpitations-heart rate today 54 bpm.  Denies recent episodes of irregular or accelerated heart rate. Avoid triggers caffeine, chocolate, EtOH, dehydration etc. Maintain physical activity  Mild obesity-weight today 201 lbs. Continue reduced calorie diet Maintain physical activity  GERD-occasionally notices acid reflux with spicy and citrus types of foods GERD diet instructions Prescribed Protonix 20 mg daily and as needed   Disposition: Follow-up with Dr. Royann Shivers or me in 9-12 months.   Thomasene Ripple. Wanda Cellucci NP-C     12/29/2022, 10:54 AM [facility] [address] Office [phone] Fax [phone]    I spent 14 minutes examining this patient, reviewing medications, and using  patient centered shared decision making involving her cardiac care.   I spent greater than 20 minutes reviewing her past medical history,  medications, and prior cardiac tests.

## 2022-12-29 ENCOUNTER — Ambulatory Visit: Payer: PRIVATE HEALTH INSURANCE | Attending: General Practice | Admitting: General Practice

## 2022-12-29 ENCOUNTER — Encounter: Payer: Self-pay | Admitting: General Practice

## 2022-12-29 VITALS — BP 126/62 | HR 54 | Wt 201.0 lb

## 2022-12-29 DIAGNOSIS — E669 Obesity, unspecified: Secondary | ICD-10-CM

## 2022-12-29 DIAGNOSIS — I10 Essential (primary) hypertension: Secondary | ICD-10-CM | POA: Diagnosis not present

## 2022-12-29 DIAGNOSIS — R002 Palpitations: Secondary | ICD-10-CM | POA: Diagnosis not present

## 2022-12-29 DIAGNOSIS — Z136 Encounter for screening for cardiovascular disorders: Secondary | ICD-10-CM

## 2022-12-29 MED ORDER — PANTOPRAZOLE SODIUM 20 MG PO TBEC: 20.0000 mg | DELAYED_RELEASE_TABLET | Freq: Every day | ORAL | 1 refills | Status: DC | PRN

## 2022-12-29 MED ORDER — LOSARTAN POTASSIUM-HCTZ 100-25 MG PO TABS: 1.0000 | ORAL_TABLET | Freq: Every day | ORAL | 3 refills | Status: DC

## 2022-12-29 NOTE — Patient Instructions (Signed)
Medication Instructions:  TAKE PROTONIX 20MG  AS NEEDED *If you need a refill on your cardiac medications before your next appointment, please call your pharmacy*  Lab Work: NONE  Follow-Up: At [facility], you and your health needs are our priority.  As part of our continuing mission to provide you with exceptional heart care, we have created designated Provider Care Teams.  These Care Teams include your primary Cardiologist (physician) and Advanced Practice Providers (APPs -  Physician Assistants and Nurse Practitioners) who all work together to provide you with the care you need, when you need it.  Your next appointment:   9 month(s)  Provider:   Thurmon Fair, MD     Other Instructions MAINTAIN YOUR PHYSICAL ACTIVITY PLEASE READ AND FOLLOW ATTACHED-CALCIUM FOODS AND GERD DIET   Food Choices for Gastroesophageal Reflux Disease, Adult When you have gastroesophageal reflux disease (GERD), the foods you eat and your eating habits are very important. Choosing the right foods can help ease your discomfort. Think about working with a food expert (dietitian) to help you make good choices. What are tips for following this plan? Reading food labels Look for foods that are low in saturated fat. Foods that may help with your symptoms include: Foods that have less than 5% of daily value (DV) of fat. Foods that have 0 grams of trans fat. Cooking Do not fry your food. Cook your food by baking, steaming, grilling, or broiling. These are all methods that do not need a lot of fat for cooking. To add flavor, try to use herbs that are low in spice and acidity. Meal planning  Choose healthy foods that are low in fat, such as: Fruits and vegetables. Whole grains. Low-fat dairy products. Lean meats, fish, and poultry. Eat small meals often instead of eating 3 large meals each day. Eat your meals slowly in a place where you are relaxed. Avoid bending over or lying down until 2-3 hours  after eating. Limit high-fat foods such as fatty meats or fried foods. Limit your intake of fatty foods, such as oils, butter, and shortening. Avoid the following as told by your doctor: Foods that cause symptoms. These may be different for different people. Keep a food diary to keep track of foods that cause symptoms. Alcohol. Drinking a lot of liquid with meals. Eating meals during the 2-3 hours before bed. Lifestyle Stay at a healthy weight. Ask your doctor what weight is healthy for you. If you need to lose weight, work with your doctor to do so safely. Exercise for at least 30 minutes on 5 or more days each week, or as told by your doctor. Wear loose-fitting clothes. Do not smoke or use any products that contain nicotine or tobacco. If you need help quitting, ask your doctor. Sleep with the head of your bed higher than your feet. Use a wedge under the mattress or blocks under the bed frame to raise the head of the bed. Chew sugar-free gum after meals. What foods should eat?  Eat a healthy, well-balanced diet of fruits, vegetables, whole grains, low-fat dairy products, lean meats, fish, and poultry. Each person is different. Foods that may cause symptoms in one person may not cause any symptoms in another person. Work with your doctor to find foods that are safe for you. The items listed above may not be a complete list of what you can eat and drink. Contact a food expert for more options. What foods should I avoid? Limiting some of these foods  may help in managing the symptoms of GERD. Everyone is different. Talk with a food expert or your doctor to help you find the exact foods to avoid, if any. Fruits Any fruits prepared with added fat. Any fruits that cause symptoms. For some people, this may include citrus fruits, such as oranges, grapefruit, pineapple, and lemons. Vegetables Deep-fried vegetables. [place] fries. Any vegetables prepared with added fat. Any vegetables that cause  symptoms. For some people, this may include tomatoes and tomato products, chili peppers, onions and garlic, and horseradish. Grains Pastries or quick breads with added fat. Meats and other proteins High-fat meats, such as fatty beef or pork, hot dogs, ribs, ham, sausage, salami, and bacon. Fried meat or protein, including fried fish and fried chicken. Nuts and nut butters, in large amounts. Dairy Whole milk and chocolate milk. Sour cream. Cream. Ice cream. Cream cheese. Milkshakes. Fats and oils Butter. Margarine. Shortening. Ghee. Beverages Coffee and tea, with or without caffeine. Carbonated beverages. Sodas. Energy drinks. Fruit juice made with acidic fruits, such as orange or grapefruit. Tomato juice. Alcoholic drinks. Sweets and desserts Chocolate and cocoa. Donuts. Seasonings and condiments Pepper. Peppermint and spearmint. Added salt. Any condiments, herbs, or seasonings that cause symptoms. For some people, this may include curry, hot sauce, or vinegar-based salad dressings. The items listed above may not be a complete list of what you should not eat and drink. Contact a food expert for more options. Questions to ask your doctor Diet and lifestyle changes are often the first steps that are taken to manage symptoms of GERD. If diet and lifestyle changes do not help, talk with your doctor about taking medicines. Where to find more information International Foundation for Gastrointestinal Disorders: aboutgerd.org Summary When you have GERD, food and lifestyle choices are very important in easing your symptoms. Eat small meals often instead of 3 large meals a day. Eat your meals slowly and in a place where you are relaxed. Avoid bending over or lying down until 2-3 hours after eating. Limit high-fat foods such as fatty meats or fried foods. This information is not intended to replace advice given to you by your health care provider. Make sure you discuss any questions you have with your  health care provider. Document Revised: 07/08/2019 Document Reviewed: 07/08/2019 Elsevier Patient Education  2024 Elsevier Inc.  Calcium Content in Foods Calcium is the most abundant mineral in the body. Most of the body's calcium supply is stored in bones and teeth. Calcium helps many parts of the body function normally, including: Blood and blood vessels. Nerves. Hormones. Muscles. Bones and teeth. When your calcium stores are low, you may be at risk for low bone mass, bone loss, and broken bones (fractures). When you get enough calcium, it helps to support strong bones and teeth throughout your life. Calcium is especially important for: Children during growth spurts. Girls during adolescence. Women who are pregnant or breastfeeding. Women after their menstrual cycle stops (postmenopause). Women whose menstrual cycle has stopped due to anorexia nervosa or regular intense exercise. People who cannot eat or digest dairy products. Vegans. Recommended daily amounts of calcium: Women (ages 54 to 24): 1,000 mg per day. Women (ages 42 and older): 1,200 mg per day. Men (ages 65 to 37): 1,000 mg per day. Men (ages 46 and older): 1,200 mg per day. Women (ages 35 to 54): 1,300 mg per day. Men (ages 86 to 110): 1,300 mg per day. General information Eat foods that are high in calcium. Try to get  most of your calcium from food. Some people may benefit from taking calcium supplements. Check with your health care provider or diet and nutrition specialist (dietitian) before starting any calcium supplements. Calcium supplements may interact with certain medicines. Too much calcium may cause other health problems, such as constipation and kidney stones. For the body to absorb calcium, it needs vitamin D. Sources of vitamin D include: Skin exposure to direct sunlight. Foods, such as egg yolks, liver, mushrooms, saltwater fish, and fortified milk. Vitamin D supplements. Check with your health care  provider or dietitian before starting any vitamin D supplements. What foods are high in calcium?  Foods that are high in calcium contain more than 100 milligrams per serving. Fruits Fortified orange juice or other fruit juice, 300 mg per 8 oz serving. Vegetables Collard greens, 360 mg per 8 oz serving. Kale, 100 mg per 8 oz serving. Bok choy, 160 mg per 8 oz serving. Grains Fortified ready-to-eat cereals, 100 to 1,000 mg per 8 oz serving. Fortified frozen waffles, 200 mg in 2 waffles. Oatmeal, 140 mg in 1 cup. Meats and other proteins Sardines, canned with bones, 325 mg per 3 oz serving. Salmon, canned with bones, 180 mg per 3 oz serving. Canned shrimp, 125 mg per 3 oz serving. Baked beans, 160 mg per 4 oz serving. Tofu, firm, made with calcium sulfate, 253 mg per 4 oz serving. Dairy Yogurt, plain, low-fat, 310 mg per 6 oz serving. Nonfat milk, 300 mg per 8 oz serving. American cheese, 195 mg per 1 oz serving. Cheddar cheese, 205 mg per 1 oz serving. Cottage cheese 2%, 105 mg per 4 oz serving. Fortified soy, rice, or almond milk, 300 mg per 8 oz serving. Mozzarella, part skim, 210 mg per 1 oz serving. The items listed above may not be a complete list of foods high in calcium. Actual amounts of calcium may be different depending on processing. Contact a dietitian for more information. What foods are lower in calcium? Foods that are lower in calcium contain 50 mg or less per serving. Fruits Apple, about 6 mg. Banana, about 12 mg. Vegetables Lettuce, 19 mg per 2 oz serving. Tomato, about 11 mg. Grains Rice, 4 mg per 6 oz serving. Boiled potatoes, 14 mg per 8 oz serving. White bread, 6 mg per slice. Meats and other proteins Egg, 27 mg per 2 oz serving. Red meat, 7 mg per 4 oz serving. Chicken, 17 mg per 4 oz serving. Fish, cod, or trout, 20 mg per 4 oz serving. Dairy Cream cheese, regular, 14 mg per 1 Tbsp serving. Brie cheese, 50 mg per 1 oz serving. Parmesan cheese, 70  mg per 1 Tbsp serving. The items listed above may not be a complete list of foods lower in calcium. Actual amounts of calcium may be different depending on processing. Contact a dietitian for more information. Summary Calcium is an important mineral in the body because it affects many functions. Getting enough calcium helps support strong bones and teeth throughout your life. Try to get most of your calcium from food. Calcium supplements may interact with certain medicines. Check with your health care provider or dietitian before starting any calcium supplements. This information is not intended to replace advice given to you by your health care provider. Make sure you discuss any questions you have with your health care provider. Document Revised: 04/24/2019 Document Reviewed: 04/24/2019 Elsevier Patient Education  2024 [employer].

## 2022-12-29 NOTE — Addendum Note (Signed)
Addended by: Alyson Ingles on: 12/29/2022 01:46 PM   Modules accepted: Orders

## 2023-02-22 ENCOUNTER — Other Ambulatory Visit: Payer: Self-pay | Admitting: General Practice

## 2023-02-22 NOTE — Telephone Encounter (Signed)
Pt's pharmacy is requesting a refill on pantoprazole. Would Dr. Royann Shivers like to refill this medication? Please address

## 2023-07-10 ENCOUNTER — Encounter (INDEPENDENT_AMBULATORY_CARE_PROVIDER_SITE_OTHER): Payer: Self-pay

## 2023-09-04 ENCOUNTER — Telehealth: Payer: Self-pay | Admitting: Cardiovascular Disease

## 2023-09-04 ENCOUNTER — Other Ambulatory Visit: Payer: Self-pay | Admitting: *Deleted

## 2023-09-04 DIAGNOSIS — E785 Hyperlipidemia, unspecified: Secondary | ICD-10-CM

## 2023-09-04 DIAGNOSIS — I10 Essential (primary) hypertension: Secondary | ICD-10-CM

## 2023-09-04 NOTE — Telephone Encounter (Signed)
Lipid panel and CMET please

## 2023-09-04 NOTE — Telephone Encounter (Signed)
 Called patient and  made her aware that this will be forward to provider for lab orders. Made patient aware once orders are received labs will be placed and patient can go to  [facility]. understanding verbalized.

## 2023-09-04 NOTE — Telephone Encounter (Signed)
 Pt requesting that lab orders be put in at the [facility] in [place], so that she's able to have them done before scheduled appt on 9/8. Please advise

## 2023-09-04 NOTE — Progress Notes (Signed)
 Called and made patient aware per Dr. Squire lab orders have been placed. Understanding verbalized

## 2023-09-07 LAB — LIPID PANEL

## 2023-09-08 LAB — COMPREHENSIVE METABOLIC PANEL WITH GFR
ALT: 24 IU/L (ref 0–32)
AST: 26 IU/L (ref 0–40)
Albumin: 4.2 g/dL (ref 3.8–4.9)
Alkaline Phosphatase: 81 IU/L (ref 44–121)
BUN/Creatinine Ratio: 14 (ref 12–28)
BUN: 13 mg/dL (ref 8–27)
Bilirubin Total: 0.6 mg/dL (ref 0.0–1.2)
CO2: 24 mmol/L (ref 20–29)
Calcium: 9.8 mg/dL (ref 8.7–10.3)
Chloride: 99 mmol/L (ref 96–106)
Creatinine, Ser: 0.91 mg/dL (ref 0.57–1.00)
Globulin, Total: 2.5 g/dL (ref 1.5–4.5)
Glucose: 94 mg/dL (ref 70–99)
Potassium: 3.7 mmol/L (ref 3.5–5.2)
Sodium: 140 mmol/L (ref 134–144)
Total Protein: 6.7 g/dL (ref 6.0–8.5)
eGFR: 72 mL/min/1.73 (ref >59)

## 2023-09-08 LAB — LIPID PANEL
Cholesterol, Total: 189 mg/dL (ref 100–199)
HDL: 50 mg/dL (ref >39)
LDL CALC COMMENT:: 3.8 ratio (ref 0.0–4.4)
LDL Chol Calc (NIH): 117 mg/dL — AB (ref 0–99)
Triglycerides: 122 mg/dL (ref 0–149)
VLDL Cholesterol Cal: 22 mg/dL (ref 5–40)

## 2023-09-11 ENCOUNTER — Ambulatory Visit: Payer: Self-pay | Admitting: Cardiovascular Disease

## 2023-09-18 ENCOUNTER — Encounter: Payer: Self-pay | Admitting: Cardiovascular Disease

## 2023-09-18 ENCOUNTER — Ambulatory Visit: Payer: PRIVATE HEALTH INSURANCE | Attending: Cardiovascular Disease | Admitting: Cardiovascular Disease

## 2023-09-18 VITALS — BP 124/75 | HR 45 | Ht 66.5 in | Wt 188.9 lb

## 2023-09-18 DIAGNOSIS — R0602 Shortness of breath: Secondary | ICD-10-CM

## 2023-09-18 DIAGNOSIS — E669 Obesity, unspecified: Secondary | ICD-10-CM | POA: Diagnosis not present

## 2023-09-18 DIAGNOSIS — I10 Essential (primary) hypertension: Secondary | ICD-10-CM | POA: Diagnosis not present

## 2023-09-18 DIAGNOSIS — E785 Hyperlipidemia, unspecified: Secondary | ICD-10-CM

## 2023-09-18 NOTE — Patient Instructions (Signed)
 Medication Instructions:  No changes *If you need a refill on your cardiac medications before your next appointment, please call your pharmacy*  Lab Work: None ordered If you have labs (blood work) drawn today and your tests are completely normal, you will receive your results only by: MyChart Message (if you have MyChart) OR A paper copy in the mail If you have any lab test that is abnormal or we need to change your treatment, we will call you to review the results.  Testing/Procedures: Your physician has requested that you have an echocardiogram. Echocardiography is a painless test that uses sound waves to create images of your heart. It provides your doctor with information about the size and shape of your heart and how well your heart's chambers and valves are working. This procedure takes approximately one hour. There are no restrictions for this procedure. Please do NOT wear cologne, perfume, aftershave, or lotions (deodorant is allowed). Please arrive 15 minutes prior to your appointment time.  Please note: We ask at that you not bring children with you during ultrasound (echo/ vascular) testing. Due to room size and safety concerns, children are not allowed in the ultrasound rooms during exams. Our front office staff cannot provide observation of children in our lobby area while testing is being conducted. An adult accompanying a patient to their appointment will only be allowed in the ultrasound room at the discretion of the ultrasound technician under special circumstances. We apologize for any inconvenience.   Your physician has requested that you have an Exercise Stress Test. An exercise tolerance test is a test to check how your heart works during exercise. You will need to walk on a treadmill for this test. An electrocardiogram (ECG) will record your heartbeat when you are at rest and when you are exercising.    Please arrive 15 minutes prior to your appointment time for  registration and insurance purposes.   The test will take approximately 45 minutes to complete.   How to prepare for your Exercise Stress Test: Do bring a list of your current medications with you.  If not listed below, you may take your medications as normal. Do wear comfortable clothes (no dresses or overalls) and walking shoes, tennis shoes preferred (no heels or open toed shoes are allowed) Do Not wear cologne, perfume, aftershave or lotions (deodorant is allowed). Do not drink or eat foods with caffeine for 24 hours before the test. (Chocolate, coffee, tea, or energy drinks) If you use an inhaler, bring it with you to the test. Do not smoke for 4 hours before the test.    If these instructions are not followed, your test will have to be rescheduled.    Follow-Up: At [facility], you and your health needs are our priority.  As part of our continuing mission to provide you with exceptional heart care, our providers are all part of one team.  This team includes your primary Cardiologist (physician) and Advanced Practice Providers or APPs (Physician Assistants and Nurse Practitioners) who all work together to provide you with the care you need, when you need it.  Your next appointment:   1 year(s)  Provider:   Jerel Balding, MD    We recommend signing up for the patient portal called MyChart.  Sign up information is provided on this After Visit Summary.  MyChart is used to connect with patients for Virtual Visits (Telemedicine).  Patients are able to view lab/test results, encounter notes, upcoming appointments, etc.  Non-urgent messages  can be sent to your provider as well.   To learn more about what you can do with MyChart, go to [email or web address].

## 2023-09-18 NOTE — Progress Notes (Unsigned)
 Cardiology Office Note:    Date:  09/18/2023   ID:  Wendy Rodriguez, DOB March 18, 1965, MRN [id number]  PCP:  Wendy Rush, MD  [facility] Cardiologist:  Wendy Balding, MD  [facility] Electrophysiologist:  None   Referring MD: Wendy Satterfield, MD   No chief complaint on file.   History of Present Illness:    Wendy Rodriguez is a 58 y.o. female with a hx of systemic hypertension and palpitations.  Her husband [name] is my patient as well.  Desarai has been busy babysitting her grandchildren and has been less attention to her own health.  She has not been checking her blood pressure at home.  She has gained substantial weight.  Today her blood pressure was elevated and remained elevated hemoglobin rechecked it after 15 minutes at rest.  She has not been getting regular exercise.  Otherwise she feels well.  She denies palpitations dizziness syncope chest pain shortness of breath at rest or with activity.  She has not had leg edema, although this was a problem in the past when she took amlodipine .  As before she has marked sinus bradycardia in the 40s but is asymptomatic.   When she was married she weighed only 115 pounds.  She underwent total hysterectomy in 2018 and has been on estradiol supplements since.  She had a recent bone scan that showed normal bone density.  She has had problems with her lower back and knees for which she has received several steroid injections and has also received oral Medrol  Dosepaks.  She has a history of esophageal reflux disease, symptoms well controlled with omeprazole.    Past Medical History:  Diagnosis Date   GERD (gastroesophageal reflux disease)    Hypertension    Osteoarthritis    Past Surgical History:  Procedure Laterality Date   ABDOMINAL HYSTERECTOMY      Current Medications: No outpatient medications have been marked as taking for the 09/18/23 encounter (Office Visit) with Octa Uplinger, Jerel, MD.     Allergies:   Dust mite extract, Sulfa  antibiotics, and Sulfamethoxazole   Social History   Socioeconomic History   Marital status: Married    Spouse name: Not on file   Number of children: Not on file   Years of education: Not on file   Highest education level: Not on file  Occupational History   Not on file  Tobacco Use   Smoking status: Never   Smokeless tobacco: Never  Substance and Sexual Activity   Alcohol use: Not Currently   Drug use: Never   Sexual activity: Not on file  Other Topics Concern   Not on file  Social History Narrative   Not on file   Social Drivers of Health   Financial Resource Strain: Not on file  Food Insecurity: Not on file  Transportation Needs: Not on file  Physical Activity: Not on file  Stress: Not on file  Social Connections: Not on file     Family History: The patient's family history includes Cirrhosis in her maternal grandfather; Diabetes in her father and paternal grandmother; Heart disease in her paternal grandmother; Hyperlipidemia in her father; Hypertension in her father and mother; Throat cancer in her maternal grandmother; Thyroid  disease in her mother.  ROS:   Please see the history of present illness.     All other systems reviewed and are negative.  EKGs/Labs/Other Studies Reviewed:    The following studies were reviewed today: 11/10/2020 labs from PCP Cholesterol 178, triglycerides 128, HDL  57, LDL 98 Hemoglobin A1c 5.5%, TSH 3.320, creatinine 1.0, hemoglobin 12.3, normal liver function tests  09/30/2021 Cholesterol 177, HDL 54, LDL 97, triglycerides 150 Hemoglobin A1c 5.5% Hemoglobin 12.6, creatinine 0.1, potassium 4.4, ALT 33, TSH 2.1  EKG:  EKG is ordered today.  Shows marked sinus bradycardia but is otherwise normal.  Uncorrected QT 488 ms.  Recent Labs: 09/07/2023: ALT 24; BUN 13; Creatinine, Ser 0.91; Potassium 3.7; Sodium 140   Recent Lipid Panel    Component Value Date/Time   CHOL 189 09/07/2023 0808   TRIG 122 09/07/2023 0808   HDL 50  09/07/2023 0808   CHOLHDL 3.8 09/07/2023 0808   LDLCALC 117 (H) 09/07/2023 0808    Risk Assessment/Calculations:       Physical Exam:    VS:  BP 124/75 (BP Location: Left Arm, Patient Position: Sitting, Cuff Size: Normal)   Pulse (!) 45   Ht 5' 6.5 (1.689 m)   Wt 188 lb 14.4 oz (85.7 kg)   LMP 09/02/2011   BMI 30.03 kg/m     Wt Readings from Last 3 Encounters:  09/18/23 188 lb 14.4 oz (85.7 kg)  12/29/22 201 lb (91.2 kg)  12/08/21 201 lb 3.2 oz (91.3 kg)     General: Alert, oriented x3, no distress, mildly obese Head: no evidence of trauma, PERRL, EOMI, no exophtalmos or lid lag, no myxedema, no xanthelasma; normal ears, nose and oropharynx Neck: normal jugular venous pulsations and no hepatojugular reflux; brisk carotid pulses without delay and no carotid bruits Chest: clear to auscultation, no signs of consolidation by percussion or palpation, normal fremitus, symmetrical and full respiratory excursions Cardiovascular: normal position and quality of the apical impulse, regular rhythm, normal first and second heart sounds, no murmurs, rubs or gallops Abdomen: no tenderness or distention, no masses by palpation, no abnormal pulsatility or arterial bruits, normal bowel sounds, no hepatosplenomegaly Extremities: no clubbing, cyanosis or edema; 2+ radial, ulnar and brachial pulses bilaterally; 2+ right femoral, posterior tibial and dorsalis pedis pulses; 2+ left femoral, posterior tibial and dorsalis pedis pulses; no subclavian or femoral bruits Neurological: grossly nonfocal Psych: Normal mood and affect       ASSESSMENT:    1. Essential hypertension   2. Hyperlipidemia, unspecified hyperlipidemia type      PLAN:    In order of problems listed above:  HTN: Not well-controlled, possibly due to weight gain.  Increase hydrochlorothiazide to 25 mg daily.  Switch to combination losartan  HCT 100/25.  Encouraged avoiding high sodium foods, regular exercise, weight  loss. Palpitations: Currently not bothering her much.  Avoid beta-blockers due to resting bradycardia. Mild obesity: Reviewed important dietary changes and regular exercise to lose weight.        Medication Adjustments/Labs and Tests Ordered: Current medicines are reviewed at length with the patient today.  Concerns regarding medicines are outlined above.  Orders Placed This Encounter  Procedures   EKG 12-Lead   No orders of the defined types were placed in this encounter.   There are no Patient Instructions on file for this visit.   Signed, Wendy Balding, MD  09/18/2023 9:52 AM    [facility]

## 2023-10-05 ENCOUNTER — Ambulatory Visit (INDEPENDENT_AMBULATORY_CARE_PROVIDER_SITE_OTHER): Payer: PRIVATE HEALTH INSURANCE | Admitting: Otolaryngology

## 2023-10-05 ENCOUNTER — Ambulatory Visit (INDEPENDENT_AMBULATORY_CARE_PROVIDER_SITE_OTHER): Payer: PRIVATE HEALTH INSURANCE | Admitting: Audiology

## 2023-10-05 ENCOUNTER — Encounter (INDEPENDENT_AMBULATORY_CARE_PROVIDER_SITE_OTHER): Payer: Self-pay | Admitting: Otolaryngology

## 2023-10-05 VITALS — BP 130/76 | HR 52 | Temp 98.0°F | Ht 66.0 in | Wt 186.0 lb

## 2023-10-05 DIAGNOSIS — H608X1 Other otitis externa, right ear: Secondary | ICD-10-CM

## 2023-10-05 DIAGNOSIS — H608X3 Other otitis externa, bilateral: Secondary | ICD-10-CM

## 2023-10-05 DIAGNOSIS — H903 Sensorineural hearing loss, bilateral: Secondary | ICD-10-CM

## 2023-10-05 MED ORDER — MOMETASONE FUROATE 0.1 % EX CREA: TOPICAL_CREAM | CUTANEOUS | 3 refills | Status: AC

## 2023-10-05 NOTE — Progress Notes (Signed)
  [address], [address] [place], [place] [address] [phone]  Audiological Evaluation    Name: Wendy Rodriguez     DOB:   [birth year]      MRN:   [id number]                                                                                     Service Date: 10/05/2023     Accompanied by: unaccompanied   Patient comes today after Dr. Karis, ENT sent a referral for a hearing evaluation due to concerns with hearing loss.   Symptoms Yes Details  Hearing loss  [x]  Needs to turn up the TV  Tinnitus  [x]  unsure  Ear pain/ infections/pressure  [x]  Right ear itchiness for years  Balance problems  []    Noise exposure history  []    Previous ear surgeries  []    Family history of hearing loss  []    Amplification  []    Other  []      Otoscopy: Right ear: Clear external ear canal and notable landmarks visualized on the tympanic membrane. Left ear:  Clear external ear canal and notable landmarks visualized on the tympanic membrane.  Tympanometry: Right ear: Type A- Normal external ear canal volume with normal middle ear pressure and tympanic membrane compliance. Left ear: Type A- Normal external ear canal volume with normal middle ear pressure and tympanic membrane compliance.   Pure tone Audiometry: Right ear- Normal to borderline normal hearing from [phone] Hz, then mild presumably sensorineural hearing loss from 6000 Hz - 8000 Hz. Left ear-  Normal to borderline normal hearing from [phone] Hz, then mild to moderate presumably sensorineural hearing loss from 6000 Hz - 8000 Hz.  Speech Audiometry: Right ear- Speech Reception Threshold (SRT) was obtained at 20 dBHL. Left ear-Speech Reception Threshold (SRT) was obtained at 20 dBHL.   Word Recognition Score Tested using NU-6 (recorded) Right ear: 100% was obtained at a presentation level of 60 dBHL with contralateral masking which is deemed as  excellent. Left ear: 100% was obtained at a presentation level of 60 dBHL with contralateral  masking which is deemed as  excellent.   The hearing test results were completed under headphones and results are deemed to be of good reliability. Test technique:  conventional    Impression: There is not a significant difference in pure-tone thresholds between ears. There is not a significant difference in the word recognition score in between ears.    Recommendations: Follow up with ENT as scheduled for today. Return for a hearing evaluation in 3 years, before if concerns with hearing changes arise or per MD recommendation.   Derek Laughter MARIE LEROUX-MARTINEZ, AUD

## 2023-10-06 DIAGNOSIS — H608X3 Other otitis externa, bilateral: Secondary | ICD-10-CM | POA: Insufficient documentation

## 2023-10-06 DIAGNOSIS — H903 Sensorineural hearing loss, bilateral: Secondary | ICD-10-CM | POA: Insufficient documentation

## 2023-10-06 NOTE — Progress Notes (Signed)
 CC: Itchy right ear, hearing loss  HPI:  Wendy Rodriguez is a 58 y.o. female who presents today complaining of itchy sensation in her right ear for the past 2 years.  She also reports occasional hearing difficulty, especially in noisy environments.  She denies any otalgia, otorrhea, or vertigo.  She has no previous ENT surgery.  She has never worn hearing aids.  Past Medical History:  Diagnosis Date   GERD (gastroesophageal reflux disease)    Hypertension    Osteoarthritis     Past Surgical History:  Procedure Laterality Date   ABDOMINAL HYSTERECTOMY      Family History  Problem Relation Age of Onset   Hypertension Mother    Thyroid  disease Mother    Hypertension Father    Hyperlipidemia Father    Diabetes Father    Throat cancer Maternal Grandmother    Cirrhosis Maternal Grandfather    Diabetes Paternal Grandmother    Heart disease Paternal Grandmother     Social History:  reports that she has never smoked. She has never used smokeless tobacco. She reports that she does not currently use alcohol. She reports that she does not use drugs.  Allergies:  Allergies  Allergen Reactions   Dust Mite Extract Other (See Comments)    sinus   Sulfa Antibiotics Rash   Sulfamethoxazole Dermatitis    Prior to Admission medications   Medication Sig Start Date End Date Taking? Authorizing Provider  Cholecalciferol (VITAMIN D3 PO) Vitamin D3   Yes [provider]  escitalopram (LEXAPRO) 10 MG tablet escitalopram 10 mg tablet  TAKE 1 TABLET BY MOUTH EVERY DAY   Yes [provider]  estradiol (ESTRACE) 2 MG tablet Take 1 tablet by mouth daily. 11/20/20  Yes [provider]  losartan -hydrochlorothiazide (HYZAAR) 100-25 MG tablet Take 1 tablet by mouth daily. 12/29/22  Yes Emelia Josefa HERO, NP  mometasone  (ELOCON ) 0.1 % cream Appply topically daily as needed for itch 10/05/23  Yes Karis Clunes, MD  Multiple Vitamins-Minerals (MULTIVITAMIN WOMEN) TABS    Yes [provider]  pantoprazole  (PROTONIX ) 20 MG tablet Take 1 tablet (20 mg total) by mouth daily as needed. 02/22/23  Yes Emelia Josefa HERO, NP  famotidine (PEPCID) 40 MG tablet  11/20/19   [provider]  vitamin C (ASCORBIC ACID) 250 MG tablet Take 250 mg by mouth daily. Patient not taking: Reported on 10/05/2023    [provider]    Blood pressure 130/76, pulse (!) 52, temperature 98 F (36.7 C), temperature source Oral, height 5' 6 (1.676 m), weight 186 lb (84.4 kg), last menstrual period 09/02/2011, SpO2 96%. Exam: General: Communicates without difficulty, well nourished, no acute distress. Head: Normocephalic, no evidence injury, no tenderness, facial buttresses intact without stepoff. Face/sinus: No tenderness to palpation and percussion. Facial movement is normal and symmetric. Eyes: PERRL, EOMI. No scleral icterus, conjunctivae clear. Neuro: CN II exam reveals vision grossly intact.  No nystagmus at any point of gaze. Ears: Auricles well formed without lesions.  Eczematous changes are noted within the right ear canal.  The left ear canal and tympanic membrane are normal.  No erythema or edema is appreciated.  The TMs are intact without fluid. Nose: External evaluation reveals normal support and skin without lesions.  Dorsum is intact.  Anterior rhinoscopy reveals congested mucosa over anterior aspect of inferior turbinates and intact septum.  No purulence noted. Oral:  Oral cavity and oropharynx are intact, symmetric, without erythema or edema.  Mucosa is moist  without lesions. Neck: Full range of motion without pain.  There is no significant lymphadenopathy.  No masses palpable.  Thyroid  bed within normal limits to palpation.  Parotid glands and submandibular glands equal bilaterally without mass.  Trachea is midline. Neuro:  CN 2-12 grossly intact.   Her hearing test shows bilateral mild high-frequency sensorineural hearing loss.  Assessment: 1.  Chronic right eczematous  otitis externa. 2.  Bilateral mild high-frequency sensorineural hearing loss, likely secondary to presbycusis.  Plan: 1.  The physical exam findings and the hearing test results are reviewed with the patient. 2.  Elocon  cream to treat the chronic eczematous otitis externa. 3.  The patient is encouraged to call with any questions or concerns.  Zriyah Kopplin W Dawnell Bryant 10/06/2023, 7:02 AM

## 2023-10-20 ENCOUNTER — Encounter (HOSPITAL_COMMUNITY): Payer: Self-pay | Admitting: *Deleted

## 2023-11-02 ENCOUNTER — Other Ambulatory Visit (HOSPITAL_COMMUNITY): Payer: PRIVATE HEALTH INSURANCE

## 2023-11-02 ENCOUNTER — Encounter (HOSPITAL_COMMUNITY): Payer: PRIVATE HEALTH INSURANCE

## 2023-11-03 ENCOUNTER — Ambulatory Visit (HOSPITAL_COMMUNITY)
Admission: RE | Admit: 2023-11-03 | Discharge: 2023-11-03 | Disposition: A | Discharge status: Home / Self Care | Payer: PRIVATE HEALTH INSURANCE | Source: Ambulatory Visit | Attending: Cardiovascular Disease | Admitting: Cardiovascular Disease

## 2023-11-03 ENCOUNTER — Ambulatory Visit (HOSPITAL_BASED_OUTPATIENT_CLINIC_OR_DEPARTMENT_OTHER)
Admission: RE | Admit: 2023-11-03 | Discharge: 2023-11-03 | Disposition: A | Discharge status: Home / Self Care | Payer: PRIVATE HEALTH INSURANCE | Source: Ambulatory Visit | Attending: Cardiovascular Disease | Admitting: Cardiovascular Disease

## 2023-11-03 ENCOUNTER — Ambulatory Visit: Payer: Self-pay | Admitting: Cardiovascular Disease

## 2023-11-03 DIAGNOSIS — R0602 Shortness of breath: Secondary | ICD-10-CM

## 2023-11-03 DIAGNOSIS — R9431 Abnormal electrocardiogram [ECG] [EKG]: Secondary | ICD-10-CM

## 2023-11-03 LAB — EXERCISE TOLERANCE TEST
Angina Index: 0
Duke Treadmill Score: 2
Estimated workload: 7.9
Exercise duration (min): 6 min
Exercise duration (sec): 37 s
MPHR: 160 {beats}/min
Peak HR: 137 {beats}/min
Percent HR: 85 %
RPE: 18
Rest HR: 48 {beats}/min
ST Depression (mm): 1 mm

## 2023-11-03 LAB — ECHOCARDIOGRAM COMPLETE
Area-P 1/2: 2.44 cm2
S' Lateral: 2.3 cm

## 2023-11-03 NOTE — Telephone Encounter (Signed)
 Croitoru, Jerel, MD to Me (Selected Message)    11/03/23 10:42 AM Result Note Abnormal ECG stress test suggestive of ischemia.  Please schedule for coronary CT angiogram.  Normal renal function on labs from August 2025 and no history of diabetes. Resting heart rate was 45 when we saw her in clinic and 48 when she presented for her stress test.  I do not think she needs any beta-blocker. Hold losartan  hydrochlorothiazide on the day of the study.  S/w the patient. Went over results and recommendations from Dr [employer].   Coronary CT ordered  Went over instructions for Coronary CT, also sent over MyChart   Wendy Rodriguez. [facility] [address]  [place], [place] [address]  Please follow these instructions carefully (unless otherwise directed):  An IV will be required for this test and Nitroglycerin will be given.   On the Night Before the Test: Be sure to Drink plenty of water. Do not consume any caffeinated/decaffeinated beverages or chocolate 12 hours prior to your test. Do not take any antihistamines 12 hours prior to your test.  On the Day of the Test: Drink plenty of water until 1 hour prior to the test. Do not eat any food 1 hour prior to test. You may take your regular medications prior to the test.  Do not take Losartan -hydrochlorothiazide on the morning of the test Patients who wear a continuous glucose monitor MUST remove the device prior to scanning. FEMALES- please wear underwire-free bra if available, avoid dresses & tight clothing  After the Test: Drink plenty of water. After receiving IV contrast, you may experience a mild flushed feeling. This is normal. On occasion, you may experience a mild rash up to 24 hours after the test. This is not dangerous. If this occurs, you can take Benadryl 25 mg, Zyrtec, Claritin, or Allegra and increase your fluid intake. (Patients taking Tikosyn should avoid Benadryl, and may take Zyrtec, Claritin, or Allegra) If  you experience trouble breathing, this can be serious. If it is severe call [phone] IMMEDIATELY. If it is mild, please call our office.  We will call to schedule your test 2-4 weeks out understanding that some insurance companies will need an authorization prior to the service being performed.   For more information and frequently asked questions, please visit our website : [email or web address]  For non-scheduling related questions, please contact the cardiac imaging nurse navigator should you have any questions/concerns: Cardiac Imaging Nurse Navigators Direct Office Dial: [phone]   For scheduling needs, including cancellations and rescheduling, please call [place], [phone].

## 2023-11-15 ENCOUNTER — Ambulatory Visit (HOSPITAL_COMMUNITY)
Admission: RE | Admit: 2023-11-15 | Discharge: 2023-11-15 | Disposition: A | Discharge status: Home / Self Care | Source: Ambulatory Visit | Attending: Cardiology | Admitting: Cardiology

## 2023-11-15 DIAGNOSIS — R9431 Abnormal electrocardiogram [ECG] [EKG]: Secondary | ICD-10-CM | POA: Diagnosis present

## 2023-11-15 MED ORDER — IOHEXOL 350 MG/ML SOLN
100.0000 mL | Freq: Once | INTRAVENOUS | Status: AC | PRN
  Administered 2023-11-15: 100 mL via INTRAVENOUS

## 2023-11-15 MED ORDER — NITROGLYCERIN 0.4 MG SL SUBL
0.8000 mg | SUBLINGUAL_TABLET | Freq: Once | SUBLINGUAL | Status: AC
  Administered 2023-11-15: 0.8 mg via SUBLINGUAL

## 2023-11-17 ENCOUNTER — Ambulatory Visit: Payer: Self-pay | Admitting: Cardiovascular Disease

## 2024-02-12 ENCOUNTER — Other Ambulatory Visit: Payer: Self-pay

## 2024-02-12 MED ORDER — LOSARTAN POTASSIUM-HCTZ 100-25 MG PO TABS: 1.0000 | ORAL_TABLET | Freq: Every day | ORAL | 3 refills | Status: AC
